# Patient Record
Sex: Female | Born: 1960 | Race: Black or African American | Hispanic: No | Marital: Single | Smoking: Former smoker
Health system: Southern US, Community
[De-identification: ages and names within clinical notes are randomized; demographics above are authoritative.]

## PROBLEM LIST (undated history)

## (undated) DIAGNOSIS — E119 Type 2 diabetes mellitus without complications: Secondary | ICD-10-CM

## (undated) DIAGNOSIS — E7800 Pure hypercholesterolemia, unspecified: Secondary | ICD-10-CM

## (undated) DIAGNOSIS — N200 Calculus of kidney: Secondary | ICD-10-CM

## (undated) DIAGNOSIS — I350 Nonrheumatic aortic (valve) stenosis: Secondary | ICD-10-CM

## (undated) DIAGNOSIS — I10 Essential (primary) hypertension: Secondary | ICD-10-CM

## (undated) DIAGNOSIS — M109 Gout, unspecified: Secondary | ICD-10-CM

## (undated) DIAGNOSIS — N189 Chronic kidney disease, unspecified: Secondary | ICD-10-CM

## (undated) HISTORY — PX: ABDOMINAL HYSTERECTOMY: SHX81

## (undated) HISTORY — DX: Chronic kidney disease, unspecified: N18.9

## (undated) HISTORY — DX: Calculus of kidney: N20.0

## (undated) HISTORY — DX: Nonrheumatic aortic (valve) stenosis: I35.0

---

## 1998-06-05 ENCOUNTER — Emergency Department (HOSPITAL_COMMUNITY): Admission: EM | Admit: 1998-06-05 | Discharge: 1998-06-05 | Payer: Self-pay | Admitting: *Deleted

## 2005-11-21 ENCOUNTER — Emergency Department (HOSPITAL_COMMUNITY): Admission: EM | Admit: 2005-11-21 | Discharge: 2005-11-21 | Payer: Self-pay | Admitting: Emergency Medicine

## 2006-02-09 ENCOUNTER — Emergency Department (HOSPITAL_COMMUNITY): Admission: EM | Admit: 2006-02-09 | Discharge: 2006-02-09 | Payer: Self-pay | Admitting: Emergency Medicine

## 2006-06-25 ENCOUNTER — Emergency Department (HOSPITAL_COMMUNITY): Admission: EM | Admit: 2006-06-25 | Discharge: 2006-06-25 | Payer: Self-pay | Admitting: Emergency Medicine

## 2006-10-16 ENCOUNTER — Emergency Department (HOSPITAL_COMMUNITY): Admission: EM | Admit: 2006-10-16 | Discharge: 2006-10-16 | Payer: Self-pay | Admitting: Emergency Medicine

## 2006-10-26 ENCOUNTER — Emergency Department (HOSPITAL_COMMUNITY): Admission: EM | Admit: 2006-10-26 | Discharge: 2006-10-26 | Payer: Self-pay | Admitting: Emergency Medicine

## 2007-02-07 ENCOUNTER — Ambulatory Visit: Payer: Self-pay | Admitting: Internal Medicine

## 2007-02-07 LAB — CONVERTED CEMR LAB
AST: 19 units/L (ref 0–37)
Alkaline Phosphatase: 88 units/L (ref 39–117)
BUN: 10 mg/dL (ref 6–23)
Basophils Relative: 1 % (ref 0–1)
Eosinophils Absolute: 0.1 10*3/uL (ref 0.0–0.7)
Eosinophils Relative: 1 % (ref 0–5)
Glucose, Bld: 119 mg/dL — ABNORMAL HIGH (ref 70–99)
HCT: 41.9 % (ref 36.0–46.0)
HDL: 50 mg/dL (ref >39)
LDL Cholesterol: 241 mg/dL — ABNORMAL HIGH (ref 0–99)
Lymphs Abs: 1.7 10*3/uL (ref 0.7–4.0)
MCHC: 32.7 g/dL (ref 30.0–36.0)
MCV: 90.1 fL (ref 78.0–100.0)
Monocytes Relative: 8 % (ref 3–12)
Platelets: 354 10*3/uL (ref 150–400)
RBC: 4.65 M/uL (ref 3.87–5.11)
Total Bilirubin: 0.3 mg/dL (ref 0.3–1.2)
Total CHOL/HDL Ratio: 6.5
Triglycerides: 181 mg/dL — ABNORMAL HIGH (ref <150)
VLDL: 36 mg/dL (ref 0–40)
WBC: 6.1 10*3/uL (ref 4.0–10.5)

## 2007-02-14 ENCOUNTER — Ambulatory Visit: Payer: Self-pay | Admitting: *Deleted

## 2007-04-17 ENCOUNTER — Ambulatory Visit: Payer: Self-pay | Admitting: Internal Medicine

## 2007-04-24 ENCOUNTER — Encounter (INDEPENDENT_AMBULATORY_CARE_PROVIDER_SITE_OTHER): Payer: Self-pay | Admitting: Family Medicine

## 2007-04-24 ENCOUNTER — Ambulatory Visit: Payer: Self-pay | Admitting: Internal Medicine

## 2007-04-24 LAB — CONVERTED CEMR LAB
Basophils Absolute: 0 10*3/uL (ref 0.0–0.1)
CO2: 26 meq/L (ref 19–32)
Calcium: 9.1 mg/dL (ref 8.4–10.5)
Chloride: 104 meq/L (ref 96–112)
Eosinophils Relative: 2 % (ref 0–5)
Lymphocytes Relative: 22 % (ref 12–46)
Neutro Abs: 5.1 10*3/uL (ref 1.7–7.7)
Neutrophils Relative %: 63 % (ref 43–77)
Platelets: 298 10*3/uL (ref 150–400)
Potassium: 4 meq/L (ref 3.5–5.3)
RDW: 14.6 % (ref 11.5–15.5)
Rhuematoid fact SerPl-aCnc: <20 intl units/mL (ref 0–20)
Sodium: 141 meq/L (ref 135–145)
T3, Total: 132.9 ng/dL (ref 80.0–204.0)

## 2007-07-24 ENCOUNTER — Encounter: Admission: RE | Admit: 2007-07-24 | Discharge: 2007-07-24 | Payer: Self-pay | Admitting: Internal Medicine

## 2007-08-03 ENCOUNTER — Ambulatory Visit (HOSPITAL_COMMUNITY): Admission: RE | Admit: 2007-08-03 | Discharge: 2007-08-03 | Payer: Self-pay | Admitting: Cardiology

## 2008-04-01 ENCOUNTER — Ambulatory Visit: Payer: Self-pay | Admitting: Internal Medicine

## 2008-04-01 ENCOUNTER — Encounter (INDEPENDENT_AMBULATORY_CARE_PROVIDER_SITE_OTHER): Payer: Self-pay | Admitting: Adult Health

## 2008-04-01 LAB — CONVERTED CEMR LAB
Albumin: 3.5 g/dL (ref 3.5–5.2)
BUN: 6 mg/dL (ref 6–23)
Band Neutrophils: 0 % (ref 0–10)
Basophils Relative: 1 % (ref 0–1)
Calcium: 9.2 mg/dL (ref 8.4–10.5)
Cholesterol: 219 mg/dL — ABNORMAL HIGH (ref 0–200)
Creatinine, Ser: 0.85 mg/dL (ref 0.40–1.20)
Eosinophils Absolute: 0.1 10*3/uL (ref 0.0–0.7)
Glucose, Bld: 140 mg/dL — ABNORMAL HIGH (ref 70–99)
HDL: 46 mg/dL (ref >39)
LDL Cholesterol: 142 mg/dL — ABNORMAL HIGH (ref 0–99)
Lymphs Abs: 1.6 10*3/uL (ref 0.7–4.0)
MCHC: 31.6 g/dL (ref 30.0–36.0)
MCV: 92.6 fL (ref 78.0–100.0)
Neutrophils Relative %: 56 % (ref 43–77)
Platelets: 267 10*3/uL (ref 150–400)
RBC: 4.07 M/uL (ref 3.87–5.11)
TSH: 1.009 microintl units/mL (ref 0.350–4.500)
Total Protein: 6.4 g/dL (ref 6.0–8.3)
Triglycerides: 157 mg/dL — ABNORMAL HIGH (ref <150)
Vit D, 25-Hydroxy: 9 ng/mL — ABNORMAL LOW (ref 30–89)

## 2008-04-07 ENCOUNTER — Encounter (INDEPENDENT_AMBULATORY_CARE_PROVIDER_SITE_OTHER): Payer: Self-pay | Admitting: Adult Health

## 2008-04-07 LAB — CONVERTED CEMR LAB: Hgb A1c MFr Bld: 7.1 % — ABNORMAL HIGH (ref 4.6–6.1)

## 2008-04-22 ENCOUNTER — Ambulatory Visit: Payer: Self-pay | Admitting: Internal Medicine

## 2008-05-12 ENCOUNTER — Ambulatory Visit: Payer: Self-pay | Admitting: Internal Medicine

## 2008-06-02 ENCOUNTER — Ambulatory Visit: Payer: Self-pay | Admitting: Internal Medicine

## 2008-07-24 ENCOUNTER — Ambulatory Visit: Payer: Self-pay | Admitting: Internal Medicine

## 2008-09-03 ENCOUNTER — Emergency Department (HOSPITAL_COMMUNITY): Admission: EM | Admit: 2008-09-03 | Discharge: 2008-09-03 | Payer: Self-pay | Admitting: Emergency Medicine

## 2008-10-24 ENCOUNTER — Encounter (INDEPENDENT_AMBULATORY_CARE_PROVIDER_SITE_OTHER): Payer: Self-pay | Admitting: Adult Health

## 2008-10-24 ENCOUNTER — Ambulatory Visit: Payer: Self-pay | Admitting: Internal Medicine

## 2008-10-24 LAB — CONVERTED CEMR LAB
ALT: 40 units/L — ABNORMAL HIGH (ref 0–35)
AST: 36 units/L (ref 0–37)
CO2: 29 meq/L (ref 19–32)
Calcium: 9.3 mg/dL (ref 8.4–10.5)
Chloride: 103 meq/L (ref 96–112)
Cholesterol: 146 mg/dL (ref 0–200)
Creatinine, Ser: 1.09 mg/dL (ref 0.40–1.20)
Helicobacter Pylori Antibody-IgG: <0.4
Potassium: 3.2 meq/L — ABNORMAL LOW (ref 3.5–5.3)
Sed Rate: 2 mm/hr (ref 0–22)
Sodium: 143 meq/L (ref 135–145)
Total CHOL/HDL Ratio: 3.7
Total Protein: 6.4 g/dL (ref 6.0–8.3)

## 2009-01-21 ENCOUNTER — Encounter (INDEPENDENT_AMBULATORY_CARE_PROVIDER_SITE_OTHER): Payer: Self-pay | Admitting: Adult Health

## 2009-01-21 ENCOUNTER — Ambulatory Visit: Payer: Self-pay | Admitting: Internal Medicine

## 2009-01-21 LAB — CONVERTED CEMR LAB
ALT: 42 units/L — ABNORMAL HIGH (ref 0–35)
AST: 33 units/L (ref 0–37)
Calcium: 8.8 mg/dL (ref 8.4–10.5)
Chloride: 107 meq/L (ref 96–112)
Creatinine, Ser: 1.08 mg/dL (ref 0.40–1.20)
Sodium: 142 meq/L (ref 135–145)
Total Bilirubin: 0.3 mg/dL (ref 0.3–1.2)
Total CHOL/HDL Ratio: 6.3
Total Protein: 6.3 g/dL (ref 6.0–8.3)
Uric Acid, Serum: 12.2 mg/dL — ABNORMAL HIGH (ref 2.4–7.0)
VLDL: 34 mg/dL (ref 0–40)
Vit D, 25-Hydroxy: 27 ng/mL — ABNORMAL LOW (ref 30–89)

## 2009-02-27 ENCOUNTER — Ambulatory Visit (HOSPITAL_COMMUNITY): Admission: RE | Admit: 2009-02-27 | Discharge: 2009-02-27 | Payer: Self-pay | Admitting: Family Medicine

## 2009-03-13 ENCOUNTER — Encounter (INDEPENDENT_AMBULATORY_CARE_PROVIDER_SITE_OTHER): Payer: Self-pay | Admitting: *Deleted

## 2009-03-25 ENCOUNTER — Ambulatory Visit: Payer: Self-pay | Admitting: Internal Medicine

## 2009-03-25 ENCOUNTER — Encounter (INDEPENDENT_AMBULATORY_CARE_PROVIDER_SITE_OTHER): Payer: Self-pay | Admitting: Adult Health

## 2009-03-25 LAB — CONVERTED CEMR LAB
ALT: 27 units/L (ref 0–35)
AST: 27 units/L (ref 0–37)
Alkaline Phosphatase: 66 units/L (ref 39–117)
LDL Cholesterol: 212 mg/dL — ABNORMAL HIGH (ref 0–99)
Potassium: 4.2 meq/L (ref 3.5–5.3)
Sodium: 140 meq/L (ref 135–145)
Total Bilirubin: 0.3 mg/dL (ref 0.3–1.2)
Total Protein: 7.1 g/dL (ref 6.0–8.3)
Uric Acid, Serum: 9.1 mg/dL — ABNORMAL HIGH (ref 2.4–7.0)
VLDL: 32 mg/dL (ref 0–40)

## 2009-05-19 ENCOUNTER — Ambulatory Visit: Payer: Self-pay | Admitting: Internal Medicine

## 2009-07-06 ENCOUNTER — Ambulatory Visit: Payer: Self-pay | Admitting: Internal Medicine

## 2009-07-06 ENCOUNTER — Encounter (INDEPENDENT_AMBULATORY_CARE_PROVIDER_SITE_OTHER): Payer: Self-pay | Admitting: Adult Health

## 2009-07-06 LAB — CONVERTED CEMR LAB: Microalb, Ur: 0.81 mg/dL (ref 0.00–1.89)

## 2009-07-30 ENCOUNTER — Emergency Department (HOSPITAL_COMMUNITY): Admission: EM | Admit: 2009-07-30 | Discharge: 2009-07-30 | Payer: Self-pay | Admitting: Emergency Medicine

## 2009-08-25 ENCOUNTER — Ambulatory Visit: Payer: Self-pay | Admitting: Family Medicine

## 2009-10-09 ENCOUNTER — Encounter (INDEPENDENT_AMBULATORY_CARE_PROVIDER_SITE_OTHER): Payer: Self-pay | Admitting: Family Medicine

## 2009-10-09 LAB — CONVERTED CEMR LAB
Cholesterol: 270 mg/dL — ABNORMAL HIGH (ref 0–200)
VLDL: 29 mg/dL (ref 0–40)

## 2010-02-14 ENCOUNTER — Encounter: Payer: Self-pay | Admitting: Internal Medicine

## 2010-02-25 NOTE — Letter (Signed)
Summary: Generic Letter  [facility]  [address]   [place], [place] [address]   Phone: [phone]  Fax: [phone]    03/13/2009  NYELLA ECKELS [address] [place], [place]  [address]  Dear Ms. CARLTON, Your Bone Scan was within normal limits. For continued bone health take Calcium 1200mg  and Vitamin D 800IU daily.          Sincerely,   Gaylyn Cheers RN

## 2012-09-21 ENCOUNTER — Other Ambulatory Visit (HOSPITAL_COMMUNITY): Payer: Self-pay | Admitting: Nurse Practitioner

## 2012-09-21 DIAGNOSIS — R109 Unspecified abdominal pain: Secondary | ICD-10-CM

## 2012-09-27 ENCOUNTER — Ambulatory Visit (HOSPITAL_COMMUNITY)
Admission: RE | Admit: 2012-09-27 | Discharge: 2012-09-27 | Disposition: A | Discharge status: Home / Self Care | Payer: No Typology Code available for payment source | Source: Ambulatory Visit | Attending: Nurse Practitioner | Admitting: Nurse Practitioner

## 2012-09-27 DIAGNOSIS — R109 Unspecified abdominal pain: Secondary | ICD-10-CM

## 2012-09-27 DIAGNOSIS — K8020 Calculus of gallbladder without cholecystitis without obstruction: Secondary | ICD-10-CM | POA: Insufficient documentation

## 2014-04-14 ENCOUNTER — Encounter (HOSPITAL_COMMUNITY): Payer: Self-pay | Admitting: *Deleted

## 2014-04-14 ENCOUNTER — Emergency Department (HOSPITAL_COMMUNITY)
Admission: EM | Admit: 2014-04-14 | Discharge: 2014-04-14 | Disposition: A | Discharge status: Home / Self Care | Payer: No Typology Code available for payment source | Attending: Emergency Medicine | Admitting: Emergency Medicine

## 2014-04-14 DIAGNOSIS — M542 Cervicalgia: Secondary | ICD-10-CM | POA: Insufficient documentation

## 2014-04-14 DIAGNOSIS — J029 Acute pharyngitis, unspecified: Secondary | ICD-10-CM | POA: Insufficient documentation

## 2014-04-14 DIAGNOSIS — E119 Type 2 diabetes mellitus without complications: Secondary | ICD-10-CM | POA: Insufficient documentation

## 2014-04-14 DIAGNOSIS — Z87891 Personal history of nicotine dependence: Secondary | ICD-10-CM | POA: Insufficient documentation

## 2014-04-14 DIAGNOSIS — R590 Localized enlarged lymph nodes: Secondary | ICD-10-CM | POA: Insufficient documentation

## 2014-04-14 DIAGNOSIS — H9202 Otalgia, left ear: Secondary | ICD-10-CM

## 2014-04-14 DIAGNOSIS — I10 Essential (primary) hypertension: Secondary | ICD-10-CM | POA: Insufficient documentation

## 2014-04-14 HISTORY — DX: Pure hypercholesterolemia, unspecified: E78.00

## 2014-04-14 HISTORY — DX: Type 2 diabetes mellitus without complications: E11.9

## 2014-04-14 HISTORY — DX: Essential (primary) hypertension: I10

## 2014-04-14 HISTORY — DX: Gout, unspecified: M10.9

## 2014-04-14 MED ORDER — TRAMADOL HCL 50 MG PO TABS: 50.0000 mg | ORAL_TABLET | Freq: Four times a day (QID) | ORAL | Status: DC | PRN

## 2014-04-14 MED ORDER — IBUPROFEN 600 MG PO TABS: 600.0000 mg | ORAL_TABLET | Freq: Four times a day (QID) | ORAL | Status: DC | PRN

## 2014-04-14 NOTE — ED Provider Notes (Signed)
CSN: [id number]     Arrival date & time 04/14/14  1730 History  This chart was scribed for a non-physician practitioner, Junius FinnerErin O'Malley, PA-C working with Rolan BuccoMelanie Belfi, MD by [place] Peace, ED Scribe. The patient was seen in TR07C/TR07C. The patient's care was started at 6:23 PM.    Chief Complaint  Patient presents with  . Neck Pain  . Otalgia      Patient is a [age] y.o. female presenting with neck pain and ear pain. The history is provided by the patient. No language interpreter was used.  Neck Pain Pain location:  L side Associated symptoms: no fever   Otalgia Associated symptoms: neck pain and sore throat   Associated symptoms: no congestion, no cough and no fever   HPI Comments: [name] is a 54 y.o. female who presents to the Emergency Department complaining of left-sided throat pain x 2 days. Pt reports that she swallowed some food and felt like it cut her throat. She also complains of pain with swallowing and left ear pain.  Pain is aching and sore, 10/10 at worst. She states she was eating pancakes, eggs, and sausage when incident occurred. No complaints of cough, congestion, or fever. She denies any recent dental surgeries. History of hypertension and DM.    Past Medical History  Diagnosis Date  . Hypertension   . Diabetes mellitus without complication   . Gout   . High cholesterol    Past Surgical History  Procedure Laterality Date  . Abdominal hysterectomy     No family history on file. History  Substance Use Topics  . Smoking status: Former Smoker    Types: Cigarettes  . Smokeless tobacco: Never Used  . Alcohol Use: No   OB History    No data available     Review of Systems  Constitutional: Negative for fever.  HENT: Positive for ear pain, sore throat and trouble swallowing. Negative for congestion.   Respiratory: Negative for cough.   Musculoskeletal: Positive for neck pain.  All other systems reviewed and are negative.     Allergies  Review of  patient's allergies indicates no known allergies.  Home Medications   Prior to Admission medications   Medication Sig Start Date End Date Taking? Authorizing Provider  ibuprofen (ADVIL,MOTRIN) 600 MG tablet Take 1 tablet (600 mg total) by mouth every 6 (six) hours as needed. 04/14/14   Junius FinnerErin O'Malley, PA-C  traMADol (ULTRAM) 50 MG tablet Take 1 tablet (50 mg total) by mouth every 6 (six) hours as needed. 04/14/14   Junius FinnerErin O'Malley, PA-C   BP 147/67 mmHg  Pulse 62  Temp(Src) 98.3 F (36.8 C) (Oral)  Resp 16  Ht 5\' 1"  (1.549 m)  Wt 126 lb (57.153 kg)  BMI 23.82 kg/m2  SpO2 100% Physical Exam  Constitutional: She is oriented to person, place, and time. She appears well-developed and well-nourished.  HENT:  Head: Normocephalic and atraumatic.  Left Ear: Hearing, tympanic membrane, external ear and ear canal normal.  Nose: Nose normal.  Mouth/Throat: Uvula is midline, oropharynx is clear and moist and mucous membranes are normal. Abnormal dentition.  Multiple missing teeth in left lower jaw. No gingival erythema, edema, bleeding or discharge. No evidence of gingival abscess  Eyes: EOM are normal.  Neck: Normal range of motion. No tracheal deviation present.  Cardiovascular: Normal rate.   Pulmonary/Chest: Effort normal. No stridor.  Musculoskeletal: Normal range of motion.  Lymphadenopathy:    She has cervical adenopathy ( left anterior ).  Neurological: She is alert and oriented to person, place, and time.  Skin: Skin is warm and dry.  Psychiatric: She has a normal mood and affect. Her behavior is normal.  Nursing note and vitals reviewed.   ED Course  Procedures (including critical care time) Labs Review Labs Reviewed - No data to display  Imaging Review No results found.   EKG Interpretation None     Medications - No data to display  6:27 PM- Treatment plan was discussed with patient who verbalizes understanding and agrees.   MDM   Final diagnoses:  Left cervical  lymphadenopathy  Neck pain on left side  Left ear pain   Pt c/o left sided throat pain after eating the breakfast 2 days ago.  No tonsillar erythema, edema, exudates. No gingival abscess or surrounding dental caries. Left TM: normal. Left anterior cervical lymphadenopathy present.  Tender to touch. No overlying erythema or warmth of skin.  Will tx conservatively with pain medication and antiinflammatories. Advised to f/u with PCP and ENT, Dr. Jenne Pane within 1 week if not improving. Return precautions provided. Pt verbalized understanding and agreement with tx plan.   I personally performed the services described in this documentation, which was scribed in my presence. The recorded information has been reviewed and is accurate. }   Junius Finner, PA-C 04/14/14 Cindy Hazy, MD 04/15/14 [phone]

## 2014-04-14 NOTE — ED Notes (Signed)
Pt reports left side neck pain radiating to left ear and gums for the past two days. Pt denies any recent dental surgeries. Pt states two days ago she swallowed some food that felt like it cut her throat. Pt reports pain ever since.

## 2014-04-14 NOTE — ED Notes (Signed)
Declined W/C at D/C and was escorted to lobby by RN. 

## 2015-02-17 ENCOUNTER — Emergency Department (HOSPITAL_COMMUNITY)
Admission: EM | Admit: 2015-02-17 | Discharge: 2015-02-17 | Discharge status: Against Medical Advice | Payer: No Typology Code available for payment source | Attending: Emergency Medicine | Admitting: Emergency Medicine

## 2015-02-17 ENCOUNTER — Emergency Department (HOSPITAL_COMMUNITY)
Admission: EM | Admit: 2015-02-17 | Discharge: 2015-02-17 | Disposition: A | Discharge status: Home / Self Care | Payer: No Typology Code available for payment source

## 2015-02-17 ENCOUNTER — Encounter (HOSPITAL_COMMUNITY): Payer: Self-pay | Admitting: *Deleted

## 2015-02-17 DIAGNOSIS — B349 Viral infection, unspecified: Secondary | ICD-10-CM | POA: Insufficient documentation

## 2015-02-17 DIAGNOSIS — R1013 Epigastric pain: Secondary | ICD-10-CM | POA: Insufficient documentation

## 2015-02-17 DIAGNOSIS — E119 Type 2 diabetes mellitus without complications: Secondary | ICD-10-CM | POA: Insufficient documentation

## 2015-02-17 DIAGNOSIS — Z8739 Personal history of other diseases of the musculoskeletal system and connective tissue: Secondary | ICD-10-CM | POA: Insufficient documentation

## 2015-02-17 DIAGNOSIS — I10 Essential (primary) hypertension: Secondary | ICD-10-CM | POA: Insufficient documentation

## 2015-02-17 DIAGNOSIS — Z87891 Personal history of nicotine dependence: Secondary | ICD-10-CM | POA: Insufficient documentation

## 2015-02-17 LAB — URINALYSIS, ROUTINE W REFLEX MICROSCOPIC
Bilirubin Urine: NEGATIVE
GLUCOSE, UA: NEGATIVE mg/dL
Ketones, ur: NEGATIVE mg/dL
NITRITE: NEGATIVE
PH: 6.5 (ref 5.0–8.0)
PROTEIN: NEGATIVE mg/dL
Specific Gravity, Urine: 1.017 (ref 1.005–1.030)

## 2015-02-17 LAB — BASIC METABOLIC PANEL
ANION GAP: 9 (ref 5–15)
BUN: 8 mg/dL (ref 6–20)
CO2: 26 mmol/L (ref 22–32)
Calcium: 9.6 mg/dL (ref 8.9–10.3)
Chloride: 106 mmol/L (ref 101–111)
Creatinine, Ser: 0.77 mg/dL (ref 0.44–1.00)
GFR calc Af Amer: >60 mL/min (ref >60)
GFR calc non Af Amer: >60 mL/min (ref >60)
GLUCOSE: 134 mg/dL — AB (ref 65–99)
POTASSIUM: 3.8 mmol/L (ref 3.5–5.1)
Sodium: 141 mmol/L (ref 135–145)

## 2015-02-17 LAB — URINE MICROSCOPIC-ADD ON

## 2015-02-17 LAB — CBC
HEMATOCRIT: 39 % (ref 36.0–46.0)
HEMOGLOBIN: 12.9 g/dL (ref 12.0–15.0)
MCH: 29.3 pg (ref 26.0–34.0)
MCHC: 33.1 g/dL (ref 30.0–36.0)
MCV: 88.6 fL (ref 78.0–100.0)
Platelets: 246 10*3/uL (ref 150–400)
RBC: 4.4 MIL/uL (ref 3.87–5.11)
RDW: 13.8 % (ref 11.5–15.5)
WBC: 6.3 10*3/uL (ref 4.0–10.5)

## 2015-02-17 MED ORDER — ONDANSETRON HCL 4 MG PO TABS: 4.0000 mg | ORAL_TABLET | Freq: Three times a day (TID) | ORAL | Status: DC | PRN

## 2015-02-17 MED ORDER — ONDANSETRON 4 MG PO TBDP
4.0000 mg | ORAL_TABLET | Freq: Once | ORAL | Status: AC
  Administered 2015-02-17: 4 mg via ORAL
  Filled 2015-02-17: qty 1

## 2015-02-17 NOTE — ED Notes (Signed)
Pt sts she has to leave because her ride is leaving. Pt understands that she will be signing out against medical advice.

## 2015-02-17 NOTE — ED Notes (Signed)
Pt here with flu like symptoms.  HA, congestion, URI, cough, body aches, nausea and vomiting.  Pt has been able to hold down fluids and does not appear in any distress

## 2015-02-17 NOTE — ED Notes (Signed)
Called for triage no answer  

## 2015-02-17 NOTE — ED Provider Notes (Signed)
CSN: [id number]     Arrival date & time 02/17/15  2109 History  By signing my name below, I, Rachel Brock, attest that this documentation has been prepared under the direction and in the presence of non-physician practitioner, Elizabeth Sauer, PA-C. Electronically Signed: Linna Brock, Scribe. 02/17/2015. 10:10 PM.    Chief Complaint  Patient presents with  . Illness   The history is provided by the patient. No language interpreter was used.    HPI Comments: Rachel Brock is a 55 y.o. female who presents to the Emergency Department complaining of flu like symptoms including productive cough with yellow sputum x 4 days. Pt endorses associated subjective fever, headaches, congestion, epigastric abdominal cramping, nausea, vomiting, and diarrhea. Pt reports that she vomited three times today and twice yesterday. Pt had 2 episodes of diarrhea today. She states that she began taking Theraflu today with relief after feeling "very hot."  Pt is able to tolerate fluids. Pt denies SOB, hematochezia, or any other associated symptoms at this time.  Past Medical History  Diagnosis Date  . Hypertension   . Diabetes mellitus without complication (HCC)   . Gout   . High cholesterol    Past Surgical History  Procedure Laterality Date  . Abdominal hysterectomy     No family history on file. Social History  Substance Use Topics  . Smoking status: Former Smoker    Types: Cigarettes  . Smokeless tobacco: Never Used  . Alcohol Use: No   OB History    No data available     Review of Systems  Constitutional: Positive for fever (Subjective).  HENT: Positive for congestion.   Eyes: Negative for visual disturbance.  Respiratory: Positive for cough.   Cardiovascular: Negative for chest pain.  Gastrointestinal: Positive for nausea, vomiting, abdominal pain and diarrhea. Negative for blood in stool.  Genitourinary: Negative for dysuria.  Musculoskeletal: Negative for back pain.  Allergic/Immunologic:  Negative for immunocompromised state.  Neurological: Positive for headaches.   Allergies  Review of patient's allergies indicates no known allergies.  Home Medications   Prior to Admission medications   Medication Sig Start Date End Date Taking? Authorizing Provider  ibuprofen (ADVIL,MOTRIN) 600 MG tablet Take 1 tablet (600 mg total) by mouth every 6 (six) hours as needed. 04/14/14   Junius Finner, PA-C  ondansetron (ZOFRAN) 4 MG tablet Take 1 tablet (4 mg total) by mouth every 8 (eight) hours as needed for nausea or vomiting. 02/17/15   Chase Picket Rector Devonshire, PA-C  traMADol (ULTRAM) 50 MG tablet Take 1 tablet (50 mg total) by mouth every 6 (six) hours as needed. 04/14/14   Junius Finner, PA-C   BP 157/96 mmHg  Pulse 81  Temp(Src) 98.2 F (36.8 C) (Oral)  Resp 16  Ht  (1.549 m)  Wt 72.576 kg  BMI 30.25 kg/m2  SpO2 100% Physical Exam  Constitutional: She is oriented to person, place, and time. She appears well-developed and well-nourished. No distress.  HENT:  Head: Normocephalic and atraumatic.  Nose: Mucosal edema present.  Mouth/Throat: Oropharynx is clear and moist.  Nasal congestion  Eyes: Conjunctivae and EOM are normal.  Neck: Neck supple. No tracheal deviation present.  Cardiovascular: Normal rate.   Pulmonary/Chest: Effort normal. No respiratory distress.  Abdominal: There is tenderness in the epigastric area.  Musculoskeletal: Normal range of motion.  Lymphadenopathy:    She has no cervical adenopathy.  Neurological: She is alert and oriented to person, place, and time.  Skin: Skin is warm and dry.  Psychiatric: She has a normal mood and affect. Her behavior is normal.  Nursing note and vitals reviewed.   ED Course  Procedures (including critical care time)  DIAGNOSTIC STUDIES: Oxygen Saturation is 100% on RA, normal by my interpretation.    COORDINATION OF CARE:   9:36 PM Will order Zofran. Discussed treatment plan with pt at bedside and pt agreed to  plan.   Labs Review Labs Reviewed  URINALYSIS, ROUTINE W REFLEX MICROSCOPIC (NOT AT [facility]) - Abnormal; Notable for the following:    APPearance CLOUDY (*)    Hgb urine dipstick SMALL (*)    Leukocytes, UA TRACE (*)    All other components within normal limits  URINE MICROSCOPIC-ADD ON - Abnormal; Notable for the following:    Squamous Epithelial / LPF 6-30 (*)    Bacteria, UA FEW (*)    All other components within normal limits  CBC  BASIC METABOLIC PANEL    Imaging Review No results found. I have personally reviewed and evaluated these lab results as part of my medical decision-making.   EKG Interpretation None      MDM   Final diagnoses:  Viral syndrome   [name] presents with cough/congestion and n/v/d. Vitals reviewed and reassuring.   Patient stating she has to leave, but labwork has not resulted yet. I have discussed my concerns as a provider and the possibility that this may worsen, especially with no lab results at present. We discussed the nature, risks and benefits, and alternatives to treatment. I have specifically discussed that without further evaluation I cannot guarantee there is not a life threatening event occuring. Time was given to allow the opportunity to ask questions and consider the options, and after the discussion, the patient decided to refuse the offered treatment. Pt is A&Ox4, his own POA and states understanding of my concerns and the possible consequences. After refusal, I made every reasonable opportunity to treat them to the best of my ability. A rx for zofran was given for n/v.  I have made the patient aware that this is an AMA discharge, but she may return at any time for further evaluation and treatment.  I personally performed the services described in this documentation, which was scribed in my presence. The recorded information has been reviewed and is accurate.    [facility] Jaequan Propes, PA-C 02/17/15 2223  Arby Barrette,  MD 02/18/15 [phone]

## 2015-02-17 NOTE — ED Notes (Signed)
Called for triage x2 no answer

## 2016-10-01 ENCOUNTER — Emergency Department (HOSPITAL_COMMUNITY)
Admission: EM | Admit: 2016-10-01 | Discharge: 2016-10-01 | Disposition: A | Discharge status: Home / Self Care | Payer: Self-pay | Attending: Emergency Medicine | Admitting: Emergency Medicine

## 2016-10-01 ENCOUNTER — Emergency Department (HOSPITAL_COMMUNITY): Payer: Self-pay

## 2016-10-01 ENCOUNTER — Encounter (HOSPITAL_COMMUNITY): Payer: Self-pay | Admitting: Emergency Medicine

## 2016-10-01 DIAGNOSIS — I10 Essential (primary) hypertension: Secondary | ICD-10-CM | POA: Insufficient documentation

## 2016-10-01 DIAGNOSIS — Z791 Long term (current) use of non-steroidal anti-inflammatories (NSAID): Secondary | ICD-10-CM | POA: Insufficient documentation

## 2016-10-01 DIAGNOSIS — Z87891 Personal history of nicotine dependence: Secondary | ICD-10-CM | POA: Insufficient documentation

## 2016-10-01 DIAGNOSIS — Z79899 Other long term (current) drug therapy: Secondary | ICD-10-CM | POA: Insufficient documentation

## 2016-10-01 DIAGNOSIS — M25562 Pain in left knee: Secondary | ICD-10-CM | POA: Insufficient documentation

## 2016-10-01 DIAGNOSIS — E119 Type 2 diabetes mellitus without complications: Secondary | ICD-10-CM | POA: Insufficient documentation

## 2016-10-01 DIAGNOSIS — Z7983 Long term (current) use of bisphosphonates: Secondary | ICD-10-CM | POA: Insufficient documentation

## 2016-10-01 MED ORDER — HYDROCODONE-ACETAMINOPHEN 5-325 MG PO TABS
1.0000 | ORAL_TABLET | Freq: Once | ORAL | Status: AC
  Administered 2016-10-01: 1 via ORAL
  Filled 2016-10-01: qty 1

## 2016-10-01 MED ORDER — HYDROCODONE-ACETAMINOPHEN 5-325 MG PO TABS: 1.0000 | ORAL_TABLET | ORAL | 0 refills | Status: DC | PRN

## 2016-10-01 MED ORDER — INDOMETHACIN 25 MG PO CAPS: 25.0000 mg | ORAL_CAPSULE | Freq: Three times a day (TID) | ORAL | 0 refills | Status: DC | PRN

## 2016-10-01 NOTE — ED Triage Notes (Addendum)
C/o L knee pain x 1 week and swelling to L ankle.  Denies injury.  Taking ibuprofen without relief.  States, "I ate grapes last night and the sugar went straight to my knee." Denies sob.

## 2016-10-01 NOTE — ED Notes (Signed)
Pt stable, ambulatory, states understanding of discharge instructions 

## 2016-10-01 NOTE — ED Notes (Signed)
CBG 114 

## 2016-10-01 NOTE — ED Notes (Signed)
Patient transported to X-ray 

## 2016-10-01 NOTE — ED Provider Notes (Signed)
[facility]-EMERGENCY DEPT Provider Note   CSN: [id number] Arrival date & time: 10/01/16  0100     History   Chief Complaint Chief Complaint  Patient presents with  . Knee Pain  . L ankle swelling    HPI Rachel Brock is a 56 y.o. female.  Patient presents with complaint of left knee pain and swelling x 1 week. No known trauma or injury. No history of the same. Pain is limited to knee joint. No fever. She has a history of DM without history of gout. She has taken Tylenol and ibuprofen without relief.    The history is provided by the patient. No language interpreter was used.    Past Medical History:  Diagnosis Date  . Diabetes mellitus without complication (HCC)   . Gout   . High cholesterol   . Hypertension     There are no active problems to display for this patient.   Past Surgical History:  Procedure Laterality Date  . ABDOMINAL HYSTERECTOMY      OB History    No data available       Home Medications    Prior to Admission medications   Medication Sig Start Date End Date Taking? Authorizing Provider  ibuprofen (ADVIL,MOTRIN) 600 MG tablet Take 1 tablet (600 mg total) by mouth every 6 (six) hours as needed. 04/14/14   Lurene Shadow, PA-C  ondansetron (ZOFRAN) 4 MG tablet Take 1 tablet (4 mg total) by mouth every 8 (eight) hours as needed for nausea or vomiting. 02/17/15   Ward, Chase Picket, PA-C  traMADol (ULTRAM) 50 MG tablet Take 1 tablet (50 mg total) by mouth every 6 (six) hours as needed. 04/14/14   Lurene Shadow, PA-C    Family History No family history on file.  Social History Social History  Substance Use Topics  . Smoking status: Former Smoker    Types: Cigarettes  . Smokeless tobacco: Never Used  . Alcohol use No     Allergies   Patient has no known allergies.   Review of Systems Review of Systems  Constitutional: Negative for chills and fever.  Musculoskeletal: Positive for arthralgias and joint swelling.       See HPI.  Skin:  Negative.  Negative for color change and wound.  Neurological: Negative.  Negative for weakness and numbness.     Physical Exam Updated Vital Signs BP (!) 143/92   Pulse (!) 57   Temp 97.7 F (36.5 C) (Oral)   Resp 18   Ht  (1.549 m)   Wt 83.9 kg (185 lb)   SpO2 100%   BMI 34.96 kg/m   Physical Exam  Constitutional: She is oriented to person, place, and time. She appears well-developed and well-nourished.  Neck: Normal range of motion.  Cardiovascular: Intact distal pulses.   Pulmonary/Chest: Effort normal.  Musculoskeletal:  Left knee is mildly swollen. There is crepitance on passive/active ROM. No redness. Thigh and calf non-tender.   Neurological: She is alert and oriented to person, place, and time.  Skin: Skin is warm and dry.     ED Treatments / Results  Labs (all labs ordered are listed, but only abnormal results are displayed) Labs Reviewed  CBG MONITORING, ED    EKG  EKG Interpretation None       Radiology Dg Knee Complete 4 Views Left  Result Date: 10/01/2016 CLINICAL DATA:  Left knee pain for 1 week. No known injury. Swelling. EXAM: LEFT KNEE - COMPLETE 4+ VIEW COMPARISON:  None. FINDINGS: Left knee appears intact. No significant effusion. No evidence of acute fracture or subluxation. No focal bone lesion or bone destruction. Bone cortex and trabecular architecture appear intact. No radiopaque soft tissue foreign bodies. Soft tissue calcifications likely represent phleboliths. IMPRESSION: No acute bony abnormalities. Electronically Signed   By: Burman NievesWilliam  Stevens M.D.   On: 10/01/2016 05:53    Procedures Procedures (including critical care time)  Medications Ordered in ED Medications  HYDROcodone-acetaminophen (NORCO/VICODIN) 5-325 MG per tablet 1 tablet (1 tablet Oral Given 10/01/16 [id number])     Initial Impression / Assessment and Plan / ED Course  I have reviewed the triage vital signs and the nursing notes.  Pertinent labs & imaging results  that were available during my care of the patient were reviewed by me and considered in my medical decision making (see chart for details).     Patient presents with left knee pain x 1 week. No injury or trauma. OTC medications without relief.   Imaging is negative. Pain is improved with Norco in the ED. She can be discharged home with PCP follow up.  Final Clinical Impressions(s) / ED Diagnoses   Final diagnoses:  None   1. Left knee pain   New Prescriptions New Prescriptions   No medications on file     Elpidio AnisUpstill, Lilyann Gravelle, Cordelia Poche-C 10/01/16 0636    Melene PlanFloyd, Dan, DO 10/01/16 [phone]

## 2016-10-03 LAB — CBG MONITORING, ED: Glucose-Capillary: 114 mg/dL — ABNORMAL HIGH (ref 65–99)

## 2016-12-30 ENCOUNTER — Emergency Department (HOSPITAL_COMMUNITY): Payer: Self-pay

## 2016-12-30 ENCOUNTER — Other Ambulatory Visit: Payer: Self-pay

## 2016-12-30 ENCOUNTER — Encounter (HOSPITAL_COMMUNITY): Payer: Self-pay

## 2016-12-30 ENCOUNTER — Emergency Department (HOSPITAL_COMMUNITY)
Admission: EM | Admit: 2016-12-30 | Discharge: 2016-12-30 | Disposition: A | Discharge status: Home / Self Care | Payer: Self-pay | Attending: Emergency Medicine | Admitting: Emergency Medicine

## 2016-12-30 DIAGNOSIS — Z79899 Other long term (current) drug therapy: Secondary | ICD-10-CM | POA: Insufficient documentation

## 2016-12-30 DIAGNOSIS — Z7984 Long term (current) use of oral hypoglycemic drugs: Secondary | ICD-10-CM | POA: Insufficient documentation

## 2016-12-30 DIAGNOSIS — R05 Cough: Secondary | ICD-10-CM | POA: Insufficient documentation

## 2016-12-30 DIAGNOSIS — I10 Essential (primary) hypertension: Secondary | ICD-10-CM | POA: Insufficient documentation

## 2016-12-30 DIAGNOSIS — E119 Type 2 diabetes mellitus without complications: Secondary | ICD-10-CM | POA: Insufficient documentation

## 2016-12-30 DIAGNOSIS — R059 Cough, unspecified: Secondary | ICD-10-CM

## 2016-12-30 DIAGNOSIS — Z87891 Personal history of nicotine dependence: Secondary | ICD-10-CM | POA: Insufficient documentation

## 2016-12-30 DIAGNOSIS — E7800 Pure hypercholesterolemia, unspecified: Secondary | ICD-10-CM | POA: Insufficient documentation

## 2016-12-30 LAB — I-STAT TROPONIN, ED: Troponin i, poc: 0 ng/mL (ref 0.00–0.08)

## 2016-12-30 LAB — CBC
HCT: 39.9 % (ref 36.0–46.0)
Hemoglobin: 13 g/dL (ref 12.0–15.0)
MCH: 28.6 pg (ref 26.0–34.0)
MCHC: 32.6 g/dL (ref 30.0–36.0)
MCV: 87.7 fL (ref 78.0–100.0)
PLATELETS: 255 10*3/uL (ref 150–400)
RBC: 4.55 MIL/uL (ref 3.87–5.11)
RDW: 14.4 % (ref 11.5–15.5)
WBC: 5.3 10*3/uL (ref 4.0–10.5)

## 2016-12-30 LAB — I-STAT BETA HCG BLOOD, ED (MC, WL, AP ONLY): I-stat hCG, quantitative: <5 m[IU]/mL (ref <5)

## 2016-12-30 LAB — BASIC METABOLIC PANEL
Anion gap: 6 (ref 5–15)
BUN: 8 mg/dL (ref 6–20)
CHLORIDE: 109 mmol/L (ref 101–111)
CO2: 24 mmol/L (ref 22–32)
CREATININE: 0.93 mg/dL (ref 0.44–1.00)
Calcium: 9.8 mg/dL (ref 8.9–10.3)
GFR calc non Af Amer: >60 mL/min (ref >60)
Glucose, Bld: 152 mg/dL — ABNORMAL HIGH (ref 65–99)
Potassium: 4.7 mmol/L (ref 3.5–5.1)
Sodium: 139 mmol/L (ref 135–145)

## 2016-12-30 MED ORDER — ALBUTEROL SULFATE HFA 108 (90 BASE) MCG/ACT IN AERS: 1.0000 | INHALATION_SPRAY | Freq: Four times a day (QID) | RESPIRATORY_TRACT | 0 refills | Status: DC | PRN

## 2016-12-30 NOTE — ED Provider Notes (Signed)
[facility] Provider Note   CSN: [id number] Arrival date & time: 12/30/16  [id number]     History   Chief Complaint Chief Complaint  Patient presents with  . Chest Pain    HPI Rachel Brock is a 56 y.o. female. Chief complaint is cough and chest pain.  HPI  no history of heart or lung disease. Patient states her last today she'll feel a bit of a cough. When she coughs for her and she feels short of breath and tight with her chest and it seems to pass. This happens sometimes at night sometimes during the day. No fever. No chest pain. States occasionally make her feel tightness in the back of her neck when she coughs a lot.  Past Medical History:  Diagnosis Date  . Diabetes mellitus without complication (HCC)   . Gout   . High cholesterol   . Hypertension     There are no active problems to display for this patient.   Past Surgical History:  Procedure Laterality Date  . ABDOMINAL HYSTERECTOMY      OB History    No data available       Home Medications    Prior to Admission medications   Medication Sig Start Date End Date Taking? Authorizing Provider  metFORMIN (GLUCOPHAGE) 500 MG tablet Take 500 mg by mouth 2 (two) times daily with a meal.   Yes [provider]  PRESCRIPTION MEDICATION Take 1 tablet by mouth every morning. BLOOD PRESSURE MEDICATION   Yes [provider]  rosuvastatin (CRESTOR) 40 MG tablet Take 40 mg by mouth every morning.   Yes [provider]  albuterol (PROVENTIL HFA;VENTOLIN HFA) 108 (90 Base) MCG/ACT inhaler Inhale 1-2 puffs into the lungs every 6 (six) hours as needed for wheezing or shortness of breath. 12/30/16   Rolland PorterJames, Aquiles Ruffini, MD  HYDROcodone-acetaminophen (NORCO/VICODIN) 5-325 MG tablet Take 1 tablet by mouth every 4 (four) hours as needed. Patient not taking: Reported on 12/30/2016 10/01/16   Elpidio AnisUpstill, Shari, PA-C  ibuprofen (ADVIL,MOTRIN) 600 MG tablet Take 1 tablet (600 mg total) by  mouth every 6 (six) hours as needed. Patient not taking: Reported on 12/30/2016 04/14/14   Lurene ShadowPhelps, Erin O, PA-C  indomethacin (INDOCIN) 25 MG capsule Take 1 capsule (25 mg total) by mouth 3 (three) times daily as needed. Patient not taking: Reported on 12/30/2016 10/01/16   Elpidio AnisUpstill, Shari, PA-C  ondansetron (ZOFRAN) 4 MG tablet Take 1 tablet (4 mg total) by mouth every 8 (eight) hours as needed for nausea or vomiting. Patient not taking: Reported on 12/30/2016 02/17/15   Ward, Chase PicketJaime Pilcher, PA-C  traMADol (ULTRAM) 50 MG tablet Take 1 tablet (50 mg total) by mouth every 6 (six) hours as needed. Patient not taking: Reported on 12/30/2016 04/14/14   Rolla PlatePhelps, Erin O, PA-C    Family History No family history on file.  Social History Social History   Tobacco Use  . Smoking status: Former Smoker    Types: Cigarettes  . Smokeless tobacco: Never Used  Substance Use Topics  . Alcohol use: No  . Drug use: No     Allergies   Patient has no known allergies.   Review of Systems Review of Systems  Constitutional: Negative for appetite change, chills, diaphoresis, fatigue and fever.  HENT: Negative for mouth sores, sore throat and trouble swallowing.   Eyes: Negative for visual disturbance.  Respiratory: Positive for cough and wheezing. Negative for chest tightness and shortness of breath.  Cardiovascular: Negative for chest pain.  Gastrointestinal: Negative for abdominal distention, abdominal pain, diarrhea, nausea and vomiting.  Endocrine: Negative for polydipsia, polyphagia and polyuria.  Genitourinary: Negative for dysuria, frequency and hematuria.  Musculoskeletal: Negative for gait problem.  Skin: Negative for color change, pallor and rash.  Neurological: Negative for dizziness, syncope, light-headedness and headaches.  Hematological: Does not bruise/bleed easily.  Psychiatric/Behavioral: Negative for behavioral problems and confusion.     Physical Exam Updated Vital Signs BP (!)  180/70 (BP Location: Right Arm)   Pulse (!) 56   Temp 97.8 F (36.6 C) (Oral)   Resp 17   Ht 5\' 2"  (1.575 m)   Wt 81.6 kg (180 lb)   SpO2 100%   BMI 32.92 kg/m   Physical Exam  Constitutional: She is oriented to person, place, and time. She appears well-developed and well-nourished. No distress.  HENT:  Head: Normocephalic.  Eyes: Conjunctivae are normal. Pupils are equal, round, and reactive to light. No scleral icterus.  Neck: Normal range of motion. Neck supple. No thyromegaly present.  Cardiovascular: Normal rate and regular rhythm. Exam reveals no gallop and no friction rub.  No murmur heard. Pulmonary/Chest: Effort normal and breath sounds normal. No respiratory distress. She has no wheezes. She has no rales.  Abdominal: Soft. Bowel sounds are normal. She exhibits no distension. There is no tenderness. There is no rebound.  Musculoskeletal: Normal range of motion.  Neurological: She is alert and oriented to person, place, and time.  Skin: Skin is warm and dry. No rash noted.  Psychiatric: She has a normal mood and affect. Her behavior is normal.     ED Treatments / Results  Labs (all labs ordered are listed, but only abnormal results are displayed) Labs Reviewed  BASIC METABOLIC PANEL - Abnormal; Notable for the following components:      Result Value   Glucose, Bld 152 (*)    All other components within normal limits  CBC  I-STAT TROPONIN, ED  I-STAT BETA HCG BLOOD, ED ([facility], [facility], [facility] ONLY)    EKG  EKG Interpretation  Date/Time:  Friday December 30 2016 09:28:07 EST Ventricular Rate:  52 PR Interval:  164 QRS Duration: 68 QT Interval:  402 QTC Calculation: 373 R Axis:   36 Text Interpretation:  Sinus bradycardia Low voltage QRS Cannot rule out Anterior infarct , age undetermined Abnormal ECG Confirmed by Rolland PorterJames, Aashish Hamm ([id number]) on 12/30/2016 11:28:38 AM       Radiology Dg Chest 2 View  Result Date: 12/30/2016 CLINICAL DATA:  Cough and shortness of breath for 1  week EXAM: CHEST  2 VIEW COMPARISON:  08/03/2007 FINDINGS: The heart size and mediastinal contours are within normal limits. Both lungs are clear. The visualized skeletal structures are unremarkable. IMPRESSION: No active cardiopulmonary disease. Electronically Signed   By: Alcide CleverMark  Lukens M.D.   On: 12/30/2016 10:15    Procedures Procedures (including critical care time)  Medications Ordered in ED Medications - No data to display   Initial Impression / Assessment and Plan / ED Course  I have reviewed the triage vital signs and the nursing notes.  Pertinent labs & imaging results that were available during my care of the patient were reviewed by me and considered in my medical decision making (see chart for details).    Normal EKG, normal chest x-ray. Normal labs. Plan for discharge. We'll trial of albuterol inhaler.  Final Clinical Impressions(s) / ED Diagnoses   Final diagnoses:  Cough    ED Discharge  Orders        Ordered    albuterol (PROVENTIL HFA;VENTOLIN HFA) 108 (90 Base) MCG/ACT inhaler  Every 6 hours PRN     12/30/16 1336       Rolland Porter, MD 12/30/16 [phone]

## 2016-12-30 NOTE — ED Triage Notes (Signed)
Per Pt, Pt is coming from home with complaints of cough, SOB, Chest pain that started 1 week. Also reports some tightness in the left arm.

## 2016-12-30 NOTE — Discharge Instructions (Signed)
Use inhaler as needed for your cough.

## 2017-05-08 ENCOUNTER — Other Ambulatory Visit: Payer: Self-pay

## 2017-05-08 ENCOUNTER — Encounter (HOSPITAL_COMMUNITY): Payer: Self-pay | Admitting: Emergency Medicine

## 2017-05-08 ENCOUNTER — Emergency Department (HOSPITAL_COMMUNITY)
Admission: EM | Admit: 2017-05-08 | Discharge: 2017-05-08 | Disposition: A | Discharge status: Home / Self Care | Payer: Self-pay | Attending: Physician Assistant | Admitting: Physician Assistant

## 2017-05-08 DIAGNOSIS — Z87891 Personal history of nicotine dependence: Secondary | ICD-10-CM | POA: Insufficient documentation

## 2017-05-08 DIAGNOSIS — Z7984 Long term (current) use of oral hypoglycemic drugs: Secondary | ICD-10-CM | POA: Insufficient documentation

## 2017-05-08 DIAGNOSIS — M79645 Pain in left finger(s): Secondary | ICD-10-CM | POA: Insufficient documentation

## 2017-05-08 DIAGNOSIS — E119 Type 2 diabetes mellitus without complications: Secondary | ICD-10-CM | POA: Insufficient documentation

## 2017-05-08 DIAGNOSIS — M25561 Pain in right knee: Secondary | ICD-10-CM | POA: Insufficient documentation

## 2017-05-08 DIAGNOSIS — Z79899 Other long term (current) drug therapy: Secondary | ICD-10-CM | POA: Insufficient documentation

## 2017-05-08 DIAGNOSIS — I10 Essential (primary) hypertension: Secondary | ICD-10-CM | POA: Insufficient documentation

## 2017-05-08 DIAGNOSIS — M25562 Pain in left knee: Secondary | ICD-10-CM | POA: Insufficient documentation

## 2017-05-08 DIAGNOSIS — M25542 Pain in joints of left hand: Secondary | ICD-10-CM

## 2017-05-08 DIAGNOSIS — M79644 Pain in right finger(s): Secondary | ICD-10-CM | POA: Insufficient documentation

## 2017-05-08 DIAGNOSIS — M25541 Pain in joints of right hand: Secondary | ICD-10-CM

## 2017-05-08 NOTE — ED Notes (Signed)
Patient not in room for vital signs. Notified provider and stated she probably left after explaining plan of care.

## 2017-05-08 NOTE — ED Provider Notes (Signed)
[facility]   CSN: [id number] Arrival date & time: 05/08/17  0813     History   Chief Complaint Chief Complaint  Patient presents with  . Knee Pain    HPI Ballard RussellBelinda A Roepke is a 57 y.o. female past medical history of hypertension, diabetes, gout, presenting to the ED with multiple weeks of pain to bilateral knees and knuckles to bilateral hands.  Patient states she has had no injury to these joints.  States she wakes up in the morning and they feel stiff, and she has progressive pain throughout the day.  She takes allopurinol daily for gout.  Has also been taking ibuprofen for pain.  Has not followed up with her primary care regarding her complaint today.  No fevers or chills, numbness, or other complaints.  The history is provided by the patient.    Past Medical History:  Diagnosis Date  . Diabetes mellitus without complication (HCC)   . Gout   . High cholesterol   . Hypertension     There are no active problems to display for this patient.   Past Surgical History:  Procedure Laterality Date  . ABDOMINAL HYSTERECTOMY       OB History   None      Home Medications    Prior to Admission medications   Medication Sig Start Date End Date Taking? Authorizing Provider  albuterol (PROVENTIL HFA;VENTOLIN HFA) 108 (90 Base) MCG/ACT inhaler Inhale 1-2 puffs into the lungs every 6 (six) hours as needed for wheezing or shortness of breath. 12/30/16   Rolland PorterJames, Mark, MD  HYDROcodone-acetaminophen (NORCO/VICODIN) 5-325 MG tablet Take 1 tablet by mouth every 4 (four) hours as needed. Patient not taking: Reported on 12/30/2016 10/01/16   Elpidio AnisUpstill, Shari, PA-C  ibuprofen (ADVIL,MOTRIN) 600 MG tablet Take 1 tablet (600 mg total) by mouth every 6 (six) hours as needed. Patient not taking: Reported on 12/30/2016 04/14/14   Lurene ShadowPhelps, Erin O, PA-C  indomethacin (INDOCIN) 25 MG capsule Take 1 capsule (25 mg total) by mouth 3 (three) times daily as  needed. Patient not taking: Reported on 12/30/2016 10/01/16   Elpidio AnisUpstill, Shari, PA-C  metFORMIN (GLUCOPHAGE) 500 MG tablet Take 500 mg by mouth 2 (two) times daily with a meal.    [provider]  ondansetron (ZOFRAN) 4 MG tablet Take 1 tablet (4 mg total) by mouth every 8 (eight) hours as needed for nausea or vomiting. Patient not taking: Reported on 12/30/2016 02/17/15   Ward, Chase PicketJaime Pilcher, PA-C  PRESCRIPTION MEDICATION Take 1 tablet by mouth every morning. BLOOD PRESSURE MEDICATION    [provider]  rosuvastatin (CRESTOR) 40 MG tablet Take 40 mg by mouth every morning.    [provider]  traMADol (ULTRAM) 50 MG tablet Take 1 tablet (50 mg total) by mouth every 6 (six) hours as needed. Patient not taking: Reported on 12/30/2016 04/14/14   Rolla PlatePhelps, Erin O, PA-C    Family History No family history on file.  Social History Social History   Tobacco Use  . Smoking status: Former Smoker    Types: Cigarettes  . Smokeless tobacco: Never Used  Substance Use Topics  . Alcohol use: No  . Drug use: No     Allergies   Patient has no known allergies.   Review of Systems Review of Systems  Constitutional: Negative for fever.  Musculoskeletal: Positive for arthralgias.     Physical Exam Updated Vital Signs BP (!) 165/70 (BP Location: Right Arm)  Pulse 75   Temp 98.2 F (36.8 C) (Oral)   Resp 16   Ht 5\' 1"  (1.549 m)   Wt 81.6 kg (180 lb)   SpO2 98%   BMI 34.01 kg/m   Physical Exam  Constitutional: She appears well-developed and well-nourished. No distress.  HENT:  Head: Normocephalic and atraumatic.  Eyes: Conjunctivae are normal.  Cardiovascular: Normal rate and intact distal pulses.  Pulmonary/Chest: Effort normal.  Musculoskeletal:  Bilateral knees without warmth, erythema, or edema.  Tenderness along bilateral joint lines.  Normal range of motion and normal gait. MCP joints of bilateral hands without edema, erythema, or warmth.  Full normal range  of motion of joints and hands. neurovascularly intact.  Psychiatric: She has a normal mood and affect. Her behavior is normal.  Nursing note and vitals reviewed.    ED Treatments / Results  Labs (all labs ordered are listed, but only abnormal results are displayed) Labs Reviewed - No data to display  EKG None  Radiology No results found.  Procedures Procedures (including critical care time)  Medications Ordered in ED Medications - No data to display   Initial Impression / Assessment and Plan / ED Course  I have reviewed the triage vital signs and the nursing notes.  Pertinent labs & imaging results that were available during my care of the patient were reviewed by me and considered in my medical decision making (see chart for details).     Pt present with aching pain & joint stiffness worsened in the morning. On exam joints were tender but no current medical concern for septic arthritis or gout as pt is afebrile & without warmth of the affected area. Presentation consistent with diagnosis of OA. Pt advised to follow up with her PCP if symptoms persist for further evaluation if conservative therapies do not work. Recommended ice, elevation, exercise, and ibuprofen. Return precaurtions discussed. Safe for discharge.  Discussed results, findings, treatment and follow up. Patient advised of return precautions. Patient verbalized understanding and agreed with plan.  Final Clinical Impressions(s) / ED Diagnoses   Final diagnoses:  Pain in both knees, unspecified chronicity  Joint pain in fingers of both hands    ED Discharge Orders    None       Robinson, [place] N, PA-C 05/08/17 1610    Abelino Derrick, MD 05/08/17 1556

## 2017-05-08 NOTE — ED Triage Notes (Signed)
Pt reports bilateral knee pain with no injury, pt also has history of gout with pain in right knuckle and toes.

## 2017-05-08 NOTE — Discharge Instructions (Signed)
Please read instructions below. Apply ice to your areas of pain for 20 minutes at a time. You can take ibuprofen every 6 hours as needed for pain. Schedule an appointment with your primary care provider to follow up on your visit today. Return to the ER for new or concerning symptoms.

## 2017-07-24 ENCOUNTER — Ambulatory Visit (INDEPENDENT_AMBULATORY_CARE_PROVIDER_SITE_OTHER): Payer: Self-pay | Admitting: Physician Assistant

## 2018-01-27 ENCOUNTER — Encounter (HOSPITAL_COMMUNITY): Payer: Self-pay | Admitting: Emergency Medicine

## 2018-01-27 ENCOUNTER — Emergency Department (HOSPITAL_COMMUNITY)
Admission: EM | Admit: 2018-01-27 | Discharge: 2018-01-27 | Disposition: A | Discharge status: Home / Self Care | Payer: Self-pay | Attending: Emergency Medicine | Admitting: Emergency Medicine

## 2018-01-27 ENCOUNTER — Emergency Department (HOSPITAL_COMMUNITY): Payer: Self-pay

## 2018-01-27 DIAGNOSIS — R1030 Lower abdominal pain, unspecified: Secondary | ICD-10-CM | POA: Insufficient documentation

## 2018-01-27 DIAGNOSIS — N200 Calculus of kidney: Secondary | ICD-10-CM | POA: Insufficient documentation

## 2018-01-27 DIAGNOSIS — Z7982 Long term (current) use of aspirin: Secondary | ICD-10-CM | POA: Insufficient documentation

## 2018-01-27 DIAGNOSIS — I10 Essential (primary) hypertension: Secondary | ICD-10-CM | POA: Insufficient documentation

## 2018-01-27 DIAGNOSIS — E119 Type 2 diabetes mellitus without complications: Secondary | ICD-10-CM | POA: Insufficient documentation

## 2018-01-27 DIAGNOSIS — Z79899 Other long term (current) drug therapy: Secondary | ICD-10-CM | POA: Insufficient documentation

## 2018-01-27 DIAGNOSIS — Z87891 Personal history of nicotine dependence: Secondary | ICD-10-CM | POA: Insufficient documentation

## 2018-01-27 DIAGNOSIS — R109 Unspecified abdominal pain: Secondary | ICD-10-CM

## 2018-01-27 DIAGNOSIS — Z7984 Long term (current) use of oral hypoglycemic drugs: Secondary | ICD-10-CM | POA: Insufficient documentation

## 2018-01-27 LAB — CBC
HEMATOCRIT: 41.8 % (ref 36.0–46.0)
HEMOGLOBIN: 13 g/dL (ref 12.0–15.0)
MCH: 27.8 pg (ref 26.0–34.0)
MCHC: 31.1 g/dL (ref 30.0–36.0)
MCV: 89.5 fL (ref 80.0–100.0)
NRBC: 0 % (ref 0.0–0.2)
Platelets: 253 10*3/uL (ref 150–400)
RBC: 4.67 MIL/uL (ref 3.87–5.11)
RDW: 14.5 % (ref 11.5–15.5)
WBC: 6.4 10*3/uL (ref 4.0–10.5)

## 2018-01-27 LAB — HEPATIC FUNCTION PANEL
ALBUMIN: 3.2 g/dL — AB (ref 3.5–5.0)
ALT: 30 U/L (ref 0–44)
AST: 27 U/L (ref 15–41)
Alkaline Phosphatase: 88 U/L (ref 38–126)
BILIRUBIN TOTAL: 0.3 mg/dL (ref 0.3–1.2)
Bilirubin, Direct: <0.1 mg/dL (ref 0.0–0.2)
Total Protein: 7.4 g/dL (ref 6.5–8.1)

## 2018-01-27 LAB — URINALYSIS, ROUTINE W REFLEX MICROSCOPIC
BILIRUBIN URINE: NEGATIVE
GLUCOSE, UA: NEGATIVE mg/dL
KETONES UR: NEGATIVE mg/dL
LEUKOCYTES UA: NEGATIVE
Nitrite: NEGATIVE
Protein, ur: NEGATIVE mg/dL
Specific Gravity, Urine: 1.018 (ref 1.005–1.030)
pH: 6 (ref 5.0–8.0)

## 2018-01-27 LAB — I-STAT BETA HCG BLOOD, ED (MC, WL, AP ONLY)

## 2018-01-27 LAB — BASIC METABOLIC PANEL
Anion gap: 5 (ref 5–15)
BUN: 10 mg/dL (ref 6–20)
CALCIUM: 10.2 mg/dL (ref 8.9–10.3)
CO2: 26 mmol/L (ref 22–32)
CREATININE: 0.97 mg/dL (ref 0.44–1.00)
Chloride: 110 mmol/L (ref 98–111)
GLUCOSE: 118 mg/dL — AB (ref 70–99)
POTASSIUM: 3.9 mmol/L (ref 3.5–5.1)
SODIUM: 141 mmol/L (ref 135–145)

## 2018-01-27 LAB — LIPASE, BLOOD: Lipase: 28 U/L (ref 11–51)

## 2018-01-27 MED ORDER — OXYCODONE-ACETAMINOPHEN 5-325 MG PO TABS
1.0000 | ORAL_TABLET | Freq: Once | ORAL | Status: AC
  Administered 2018-01-27: 1 via ORAL
  Filled 2018-01-27: qty 1

## 2018-01-27 MED ORDER — ONDANSETRON HCL 4 MG PO TABS: 4.0000 mg | ORAL_TABLET | ORAL | Status: DC | PRN

## 2018-01-27 MED ORDER — OXYCODONE-ACETAMINOPHEN 5-325 MG PO TABS: 1.0000 | ORAL_TABLET | Freq: Four times a day (QID) | ORAL | 0 refills | Status: AC | PRN

## 2018-01-27 NOTE — ED Triage Notes (Signed)
Pt states she has had right flank pain, lower abd cramping for approx a week. Pt complains of pressure and feeling like she needs to urinate. Pt states she has burning and pressure in her vaginal as well.

## 2018-01-27 NOTE — Discharge Instructions (Signed)
CT scan showed a very small stone on the right side that you should pass in a few days. If symptoms do not improve in the next few days you should try to make an appointment with Urology.

## 2018-01-27 NOTE — ED Notes (Signed)
Patient verbalizes understanding of discharge instructions. Opportunity for questioning and answers were provided. Armband removed by staff, pt discharged from ED.  

## 2018-01-27 NOTE — ED Provider Notes (Signed)
[facility] Provider [facility]   CSN: [id number] Arrival date & time: 01/27/18  1344     History   Chief Complaint Chief Complaint  Patient presents with  . Flank Pain    HPI Rachel Brock is a 58 y.o. female with a past medical history significant for HTN, T2DM, HLD, gout who presents today complaining of right flank pain and lower abdominal cramping. Patient reports that she has had right flank pain for about a week. She describe the pain severe and brought her down to her knee. She also reports burning with urination, increase frequency and hesitancy. Patient was seen by PCP on Friday when a UA was done and did not show any infection. Patient was told to go to the ED if symptoms persisted. She reports some nausea associated with her symptoms but denies any vomiting, fever, chills, chest pain, sob, vaginal discharge or hematuria. Patient does have a history of kidney stones.   Past Medical History:  Diagnosis Date  . Diabetes mellitus without complication (HCC)   . Gout   . High cholesterol   . Hypertension     There are no active problems to display for this patient.   Past Surgical History:  Procedure Laterality Date  . ABDOMINAL HYSTERECTOMY       OB History   No obstetric history on file.      Home Medications    Prior to Admission medications   Medication Sig Start Date End Date Taking? Authorizing Provider  AMLODIPINE BESYLATE PO Take 1 tablet by mouth daily.   Yes [provider]  aspirin EC 81 MG tablet Take 81 mg by mouth daily.   Yes [provider]  LOSARTAN POTASSIUM PO Take 1 tablet by mouth daily.   Yes [provider]  metFORMIN (GLUCOPHAGE) 500 MG tablet Take 500 mg by mouth 2 (two) times daily with a meal.   Yes [provider]  rosuvastatin (CRESTOR) 40 MG tablet Take 40 mg by mouth every morning.   Yes [provider]  albuterol (PROVENTIL HFA;VENTOLIN HFA) 108 (90  Base) MCG/ACT inhaler Inhale 1-2 puffs into the lungs every 6 (six) hours as needed for wheezing or shortness of breath. Patient not taking: Reported on 01/27/2018 12/30/16   Rolland PorterJames, Mark, MD  HYDROcodone-acetaminophen (NORCO/VICODIN) 5-325 MG tablet Take 1 tablet by mouth every 4 (four) hours as needed. Patient not taking: Reported on 12/30/2016 10/01/16   Elpidio AnisUpstill, Shari, PA-C  ibuprofen (ADVIL,MOTRIN) 600 MG tablet Take 1 tablet (600 mg total) by mouth every 6 (six) hours as needed. Patient not taking: Reported on 12/30/2016 04/14/14   Lurene ShadowPhelps, Erin O, PA-C  indomethacin (INDOCIN) 25 MG capsule Take 1 capsule (25 mg total) by mouth 3 (three) times daily as needed. Patient not taking: Reported on 12/30/2016 10/01/16   Elpidio AnisUpstill, Shari, PA-C  ondansetron (ZOFRAN) 4 MG tablet Take 1 tablet (4 mg total) by mouth every 8 (eight) hours as needed for nausea or vomiting. Patient not taking: Reported on 12/30/2016 02/17/15   Ward, Chase PicketJaime Pilcher, PA-C  oxyCODONE-acetaminophen (PERCOCET) 5-325 MG tablet Take 1 tablet by mouth every 6 (six) hours as needed for up to 5 days for severe pain. 01/27/18 02/01/18  Rolland Steinert, Lilia ArgueAbdoulaye, MD  traMADol (ULTRAM) 50 MG tablet Take 1 tablet (50 mg total) by mouth every 6 (six) hours as needed. Patient not taking: Reported on 12/30/2016 04/14/14   Rolla PlatePhelps, Erin O, PA-C    Family History History reviewed. No pertinent family  history.  Social History Social History   Tobacco Use  . Smoking status: Former Smoker    Types: Cigarettes  . Smokeless tobacco: Never Used  Substance Use Topics  . Alcohol use: No  . Drug use: No     Allergies   Patient has no known allergies.   Review of Systems Review of Systems  Constitutional: Negative.   HENT: Negative.   Eyes: Negative.   Respiratory: Negative.   Cardiovascular: Negative.   Gastrointestinal: Positive for nausea.  Endocrine: Negative.   Genitourinary: Positive for difficulty urinating, dysuria, frequency and urgency. Negative for  hematuria.  Musculoskeletal: Negative.   Allergic/Immunologic: Negative.   Neurological: Negative.   Hematological: Negative.     Physical Exam Updated Vital Signs BP (!) 154/77 (BP Location: Left Arm)   Pulse 61   Temp 98.2 F (36.8 C) (Oral)   Resp 16   Ht 5\' 1"  (1.549 m)   Wt 81.6 kg   SpO2 100%   BMI 34.01 kg/m   Physical Exam Constitutional:      Appearance: Normal appearance.  HENT:     Head: Normocephalic and atraumatic.     Nose: Nose normal.     Mouth/Throat:     Mouth: Mucous membranes are moist.  Eyes:     Extraocular Movements: Extraocular movements intact.     Conjunctiva/sclera: Conjunctivae normal.     Pupils: Pupils are equal, round, and reactive to light.  Neck:     Musculoskeletal: Normal range of motion and neck supple.  Cardiovascular:     Rate and Rhythm: Normal rate.  Pulmonary:     Effort: Pulmonary effort is normal.     Breath sounds: Normal breath sounds.  Abdominal:     General: Abdomen is flat.     Tenderness: There is no right CVA tenderness or left CVA tenderness.     Comments: Right flank tenderness with palpation  Musculoskeletal: Normal range of motion.  Neurological:     Mental Status: She is alert.      ED Treatments / Results  Labs (all labs ordered are listed, but only abnormal results are displayed) Labs Reviewed  URINALYSIS, ROUTINE W REFLEX MICROSCOPIC - Abnormal; Notable for the following components:      Result Value   APPearance HAZY (*)    Hgb urine dipstick LARGE (*)    RBC / HPF >50 (*)    Bacteria, UA RARE (*)    All other components within normal limits  BASIC METABOLIC PANEL - Abnormal; Notable for the following components:   Glucose, Bld 118 (*)    All other components within normal limits  HEPATIC FUNCTION PANEL - Abnormal; Notable for the following components:   Albumin 3.2 (*)    All other components within normal limits  CBC  LIPASE, BLOOD  I-STAT BETA HCG BLOOD, ED ([facility], [facility], [facility] ONLY)     EKG None  Radiology Ct Renal Stone Study  Result Date: 01/27/2018 CLINICAL DATA:  RIGHT flank pain, lower abdominal pain for a week. Vaginal pain and pressure. History of hysterectomy and cholelithiasis. EXAM: CT ABDOMEN AND PELVIS WITHOUT CONTRAST TECHNIQUE: Multidetector CT imaging of the abdomen and pelvis was performed following the standard protocol without IV contrast. COMPARISON:  None. FINDINGS: LOWER CHEST: Lung bases are clear. The visualized heart size is normal. No pericardial effusion. HEPATOBILIARY: Punctate dependent gallstone without CT findings of acute cholecystitis. Negative non-contrast CT liver. PANCREAS: Normal. SPLEEN: Normal. ADRENALS/URINARY TRACT: Kidneys are orthotopic, demonstrating normal size and morphology.  No nephrolithiasis, hydronephrosis; limited assessment for renal masses by nonenhanced CT. The unopacified ureters are normal in course and caliber, punctate calculus RIGHT ureterovesicular junction. Urinary bladder is adequately distended and unremarkable. Normal adrenal glands. STOMACH/BOWEL: The stomach, small and large bowel are normal in course and caliber without inflammatory changes, sensitivity decreased by lack of enteric contrast. Mild sigmoid colonic diverticulosis. Normal appendix. VASCULAR/LYMPHATIC: Aortoiliac vessels are normal in course and caliber. Mild calcific atherosclerosis. No lymphadenopathy by CT size criteria. REPRODUCTIVE: Status post hysterectomy. OTHER: No intraperitoneal free fluid or free air. Small fat containing umbilical hernia. MUSCULOSKELETAL: Non-acute. IMPRESSION: 1. Punctate non-obstructing RIGHT ureterovesicular junction calculus. 2. Cholelithiasis without CT findings of acute cholecystitis. Aortic Atherosclerosis (ICD10-I70.0). Electronically Signed   By: Awilda Metro M.D.   On: 01/27/2018 19:00    Procedures Procedures (including critical care time)  Medications Ordered in ED Medications  ondansetron (ZOFRAN) tablet 4  mg (has no administration in time range)  oxyCODONE-acetaminophen (PERCOCET/ROXICET) 5-325 MG per tablet 1 tablet (1 tablet Oral Given 01/27/18 1946)     Initial Impression / Assessment and Plan / ED Course  I have reviewed the triage vital signs and the nursing notes.  Pertinent labs & imaging results that were available during my care of the patient were reviewed by me and considered in my medical decision making (see chart for details).    Patient is a 58 yo female who presents with right flank pain associated with dysuria, hesitancy, increase frequency. No fever, chills other signs concerning for systemic infection, however patient endorses nausea. On exam patient does not have CVA tenderness but is tender to palpation. Patient does have a history of kidney stone. Recent UA at PCP office was negative for UTI however given worsening symptoms we repeated UA which showed hemoglobin but no leukocytes or nitrites. CT study for renal stone showed punctate non obstructing right renal stone with no signs of hydronephrosis. Given negative UA and imaging finding discuss patient will be discharged home with pain meds with the expectation that she will pass the stone in the upcoming days. She was instructed to contact Urology if symptoms do not improve in the next few days for further evaluation. Patient was in agreement with plan and verbalized understanding. Patient was stable on discharge.   Final Clinical Impressions(s) / ED Diagnoses   Final diagnoses:  Kidney stone  Flank pain    ED Discharge Orders         Ordered    oxyCODONE-acetaminophen (PERCOCET) 5-325 MG tablet  Every 6 hours PRN     01/27/18 1954           Lovena Neighbours, MD 01/27/18 2046    Blane Ohara, MD 01/28/18 [phone]

## 2018-02-07 ENCOUNTER — Other Ambulatory Visit (HOSPITAL_COMMUNITY): Payer: Self-pay | Admitting: *Deleted

## 2018-02-07 DIAGNOSIS — Z1231 Encounter for screening mammogram for malignant neoplasm of breast: Secondary | ICD-10-CM

## 2018-05-03 ENCOUNTER — Ambulatory Visit (HOSPITAL_COMMUNITY): Payer: Self-pay

## 2018-08-21 ENCOUNTER — Ambulatory Visit (HOSPITAL_COMMUNITY): Payer: Self-pay

## 2018-11-09 ENCOUNTER — Other Ambulatory Visit: Payer: Self-pay

## 2018-11-09 DIAGNOSIS — Z20822 Contact with and (suspected) exposure to covid-19: Secondary | ICD-10-CM

## 2018-11-10 ENCOUNTER — Telehealth: Payer: Self-pay

## 2018-11-10 NOTE — Telephone Encounter (Signed)
Pt called for covid results- advised results are not back. Sent pt link to sign up for MyChart.

## 2018-11-11 LAB — NOVEL CORONAVIRUS, NAA: SARS-CoV-2, NAA: NOT DETECTED

## 2018-11-12 ENCOUNTER — Telehealth: Payer: Self-pay | Admitting: Nurse Practitioner

## 2018-11-12 NOTE — Telephone Encounter (Signed)
Pt aware covid lab test negative, not detected °

## 2019-02-26 ENCOUNTER — Other Ambulatory Visit: Payer: Self-pay

## 2019-05-02 ENCOUNTER — Other Ambulatory Visit: Payer: Self-pay

## 2019-05-02 ENCOUNTER — Emergency Department (HOSPITAL_COMMUNITY)
Admission: EM | Admit: 2019-05-02 | Discharge: 2019-05-02 | Disposition: A | Discharge status: Home / Self Care | Payer: Self-pay | Attending: Emergency Medicine | Admitting: Emergency Medicine

## 2019-05-02 ENCOUNTER — Encounter (HOSPITAL_COMMUNITY): Payer: Self-pay | Admitting: Emergency Medicine

## 2019-05-02 DIAGNOSIS — Z5321 Procedure and treatment not carried out due to patient leaving prior to being seen by health care provider: Secondary | ICD-10-CM | POA: Insufficient documentation

## 2019-05-02 DIAGNOSIS — R1030 Lower abdominal pain, unspecified: Secondary | ICD-10-CM | POA: Insufficient documentation

## 2019-05-02 LAB — URINALYSIS, ROUTINE W REFLEX MICROSCOPIC
Bilirubin Urine: NEGATIVE
Glucose, UA: NEGATIVE mg/dL
Ketones, ur: NEGATIVE mg/dL
Leukocytes,Ua: NEGATIVE
Nitrite: NEGATIVE
Protein, ur: NEGATIVE mg/dL
Specific Gravity, Urine: 1.015 (ref 1.005–1.030)
pH: 5 (ref 5.0–8.0)

## 2019-05-02 LAB — CBG MONITORING, ED: Glucose-Capillary: 104 mg/dL — ABNORMAL HIGH (ref 70–99)

## 2019-05-02 NOTE — ED Triage Notes (Signed)
Pt flank pain since Monday. Reports urine is really dark yellow. Will have intermittent severe pains. Reports ibuprofen not helping. Sometimes heat will help. Tried cranberry juice without any relief.

## 2019-05-06 ENCOUNTER — Other Ambulatory Visit: Payer: Self-pay

## 2019-05-06 ENCOUNTER — Emergency Department (HOSPITAL_COMMUNITY)
Admission: EM | Admit: 2019-05-06 | Discharge: 2019-05-06 | Disposition: A | Discharge status: Home / Self Care | Payer: Self-pay | Attending: Emergency Medicine | Admitting: Emergency Medicine

## 2019-05-06 ENCOUNTER — Encounter (HOSPITAL_COMMUNITY): Payer: Self-pay

## 2019-05-06 ENCOUNTER — Emergency Department (HOSPITAL_COMMUNITY): Payer: Self-pay

## 2019-05-06 DIAGNOSIS — K8020 Calculus of gallbladder without cholecystitis without obstruction: Secondary | ICD-10-CM | POA: Insufficient documentation

## 2019-05-06 DIAGNOSIS — N200 Calculus of kidney: Secondary | ICD-10-CM | POA: Insufficient documentation

## 2019-05-06 LAB — COMPREHENSIVE METABOLIC PANEL
ALT: 19 U/L (ref 0–44)
AST: 20 U/L (ref 15–41)
Albumin: 3.3 g/dL — ABNORMAL LOW (ref 3.5–5.0)
Alkaline Phosphatase: 83 U/L (ref 38–126)
Anion gap: 7 (ref 5–15)
BUN: 10 mg/dL (ref 6–20)
CO2: 26 mmol/L (ref 22–32)
Calcium: 10 mg/dL (ref 8.9–10.3)
Chloride: 107 mmol/L (ref 98–111)
Creatinine, Ser: 0.94 mg/dL (ref 0.44–1.00)
GFR calc Af Amer: >60 mL/min (ref >60)
GFR calc non Af Amer: >60 mL/min (ref >60)
Glucose, Bld: 109 mg/dL — ABNORMAL HIGH (ref 70–99)
Potassium: 4.1 mmol/L (ref 3.5–5.1)
Sodium: 140 mmol/L (ref 135–145)
Total Bilirubin: 0.4 mg/dL (ref 0.3–1.2)
Total Protein: 7.1 g/dL (ref 6.5–8.1)

## 2019-05-06 LAB — CBC WITH DIFFERENTIAL/PLATELET
Abs Immature Granulocytes: 0.02 10*3/uL (ref 0.00–0.07)
Basophils Absolute: 0 10*3/uL (ref 0.0–0.1)
Basophils Relative: 1 %
Eosinophils Absolute: 0.1 10*3/uL (ref 0.0–0.5)
Eosinophils Relative: 1 %
HCT: 43.4 % (ref 36.0–46.0)
Hemoglobin: 13.6 g/dL (ref 12.0–15.0)
Immature Granulocytes: 0 %
Lymphocytes Relative: 25 %
Lymphs Abs: 1.3 10*3/uL (ref 0.7–4.0)
MCH: 28.8 pg (ref 26.0–34.0)
MCHC: 31.3 g/dL (ref 30.0–36.0)
MCV: 91.8 fL (ref 80.0–100.0)
Monocytes Absolute: 0.4 10*3/uL (ref 0.1–1.0)
Monocytes Relative: 8 %
Neutro Abs: 3.3 10*3/uL (ref 1.7–7.7)
Neutrophils Relative %: 65 %
Platelets: 281 10*3/uL (ref 150–400)
RBC: 4.73 MIL/uL (ref 3.87–5.11)
RDW: 14.4 % (ref 11.5–15.5)
WBC: 5.2 10*3/uL (ref 4.0–10.5)
nRBC: 0 % (ref 0.0–0.2)

## 2019-05-06 LAB — URINALYSIS, ROUTINE W REFLEX MICROSCOPIC
Bilirubin Urine: NEGATIVE
Glucose, UA: NEGATIVE mg/dL
Ketones, ur: NEGATIVE mg/dL
Leukocytes,Ua: NEGATIVE
Nitrite: NEGATIVE
Protein, ur: NEGATIVE mg/dL
Specific Gravity, Urine: 1.013 (ref 1.005–1.030)
pH: 5 (ref 5.0–8.0)

## 2019-05-06 MED ORDER — OXYCODONE-ACETAMINOPHEN 5-325 MG PO TABS: 1.0000 | ORAL_TABLET | Freq: Four times a day (QID) | ORAL | 0 refills | Status: DC | PRN

## 2019-05-06 MED ORDER — ONDANSETRON 4 MG PO TBDP: 4.0000 mg | ORAL_TABLET | Freq: Three times a day (TID) | ORAL | 0 refills | Status: AC | PRN

## 2019-05-06 MED ORDER — CEPHALEXIN 500 MG PO CAPS: 500.0000 mg | ORAL_CAPSULE | Freq: Two times a day (BID) | ORAL | 0 refills | Status: AC

## 2019-05-06 MED ORDER — TAMSULOSIN HCL 0.4 MG PO CAPS: 0.4000 mg | ORAL_CAPSULE | Freq: Every day | ORAL | 0 refills | Status: AC

## 2019-05-06 MED ORDER — KETOROLAC TROMETHAMINE 30 MG/ML IJ SOLN
15.0000 mg | Freq: Once | INTRAMUSCULAR | Status: AC
  Administered 2019-05-06: 15 mg via INTRAVENOUS
  Filled 2019-05-06: qty 1

## 2019-05-06 MED ORDER — SODIUM CHLORIDE 0.9 % IV BOLUS
1000.0000 mL | Freq: Once | INTRAVENOUS | Status: AC
  Administered 2019-05-06: 1000 mL via INTRAVENOUS

## 2019-05-06 MED ORDER — ONDANSETRON HCL 4 MG/2ML IJ SOLN
4.0000 mg | Freq: Once | INTRAMUSCULAR | Status: AC
  Administered 2019-05-06: 4 mg via INTRAVENOUS
  Filled 2019-05-06: qty 2

## 2019-05-06 MED ORDER — SODIUM CHLORIDE 0.9 % IV SOLN
1.0000 g | Freq: Once | INTRAVENOUS | Status: AC
  Administered 2019-05-06: 1 g via INTRAVENOUS
  Filled 2019-05-06: qty 10

## 2019-05-06 NOTE — ED Notes (Signed)
Pt ambulated to rr without difficulty

## 2019-05-06 NOTE — ED Provider Notes (Signed)
[facility] EMERGENCY DEPARTMENT Provider Note   CSN: [id number] Arrival date & time: 05/06/19  1143     History No chief complaint on file.   Rachel Brock is a 59 y.o. female.  HPI      Rachel Brock is a 59 y.o. female, with a history of DM, gout, hypercholesterolemia, HTN, hysterectomy, presenting to the ED primarily complaining of left lower back pain beginning this morning. Pain is waxing and waning, aching, then sharp, currently 7/10, radiating into the left flank.  She states she had similar pain in the past with kidney stone. She notes several days of urinary frequency, urgency, and suprapubic pressure.  She has had similar symptoms in the past with UTI. She also mentions pressure in the vaginal region for the last several days. Last bowel movement was yesterday and was firm. Denies fever/chills, N/V/C/D, abdominal pain, hematuria, dysuria, protrusion from the vagina, vaginal bleeding/discharge, difficulty with bowel movements, hematochezia/melena, or any other complaints.  Her OBGYN is Dr. Reggie Pile.  Past Medical History:  Diagnosis Date  . Diabetes mellitus without complication (HCC)   . Gout   . High cholesterol   . Hypertension     There are no problems to display for this patient.   Past Surgical History:  Procedure Laterality Date  . ABDOMINAL HYSTERECTOMY       OB History   No obstetric history on file.     No family history on file.  Social History   Tobacco Use  . Smoking status: Former Smoker    Types: Cigarettes  . Smokeless tobacco: Never Used  Substance Use Topics  . Alcohol use: No  . Drug use: No    Home Medications Prior to Admission medications   Medication Sig Start Date End Date Taking? Authorizing Provider  albuterol (PROVENTIL HFA;VENTOLIN HFA) 108 (90 Base) MCG/ACT inhaler Inhale 1-2 puffs into the lungs every 6 (six) hours as needed for wheezing or shortness of breath. Patient not taking: Reported on  01/27/2018 12/30/16   Rolland Porter, MD  AMLODIPINE BESYLATE PO Take 1 tablet by mouth daily.    [provider]  aspirin EC 81 MG tablet Take 81 mg by mouth daily.    [provider]  cephALEXin (KEFLEX) 500 MG capsule Take 1 capsule (500 mg total) by mouth 2 (two) times daily for 5 days. 05/06/19 05/11/19  Coden Franchi, Hillard Danker, PA-C  HYDROcodone-acetaminophen (NORCO/VICODIN) 5-325 MG tablet Take 1 tablet by mouth every 4 (four) hours as needed. Patient not taking: Reported on 12/30/2016 10/01/16   Elpidio Anis, PA-C  ibuprofen (ADVIL,MOTRIN) 600 MG tablet Take 1 tablet (600 mg total) by mouth every 6 (six) hours as needed. Patient not taking: Reported on 12/30/2016 04/14/14   Lurene Shadow, PA-C  indomethacin (INDOCIN) 25 MG capsule Take 1 capsule (25 mg total) by mouth 3 (three) times daily as needed. Patient not taking: Reported on 12/30/2016 10/01/16   Elpidio Anis, PA-C  LOSARTAN POTASSIUM PO Take 1 tablet by mouth daily.    [provider]  metFORMIN (GLUCOPHAGE) 500 MG tablet Take 500 mg by mouth 2 (two) times daily with a meal.    [provider]  ondansetron (ZOFRAN ODT) 4 MG disintegrating tablet Take 1 tablet (4 mg total) by mouth every 8 (eight) hours as needed for nausea or vomiting. 05/06/19   Delano Frate C, PA-C  ondansetron (ZOFRAN) 4 MG tablet Take 1 tablet (4 mg total) by mouth every 8 (eight) hours as  needed for nausea or vomiting. Patient not taking: Reported on 12/30/2016 02/17/15   Ward, Chase Picket, PA-C  oxyCODONE-acetaminophen (PERCOCET/ROXICET) 5-325 MG tablet Take 1-2 tablets by mouth every 6 (six) hours as needed for severe pain. 05/06/19   Nixie Laube C, PA-C  rosuvastatin (CRESTOR) 40 MG tablet Take 40 mg by mouth every morning.    [provider]  tamsulosin (FLOMAX) 0.4 MG CAPS capsule Take 1 capsule (0.4 mg total) by mouth daily. 05/06/19   Daci Stubbe C, PA-C  traMADol (ULTRAM) 50 MG tablet Take 1 tablet (50 mg total) by mouth every 6 (six)  hours as needed. Patient not taking: Reported on 12/30/2016 04/14/14   Lurene Shadow, PA-C    Allergies    Patient has no known allergies.  Review of Systems   Review of Systems  Constitutional: Negative for chills, diaphoresis and fever.  Respiratory: Negative for shortness of breath.   Cardiovascular: Negative for chest pain.  Gastrointestinal: Negative for abdominal pain, blood in stool, constipation, diarrhea, nausea and vomiting.  Genitourinary: Positive for flank pain, frequency and urgency. Negative for difficulty urinating, dysuria and hematuria.  Musculoskeletal: Positive for back pain.  Neurological: Negative for weakness and numbness.  All other systems reviewed and are negative.   Physical Exam Updated Vital Signs BP (!) 165/72 (BP Location: Left Arm)   Pulse 62   Temp 98.2 F (36.8 C) (Oral)   Resp 17   Ht 5\' 1"  (1.549 m)   Wt 81.6 kg   SpO2 100%   BMI 34.01 kg/m   Physical Exam Vitals and nursing note reviewed.  Constitutional:      General: She is not in acute distress.    Appearance: She is well-developed. She is not diaphoretic.  HENT:     Head: Normocephalic and atraumatic.     Mouth/Throat:     Mouth: Mucous membranes are moist.     Pharynx: Oropharynx is clear.  Eyes:     Conjunctiva/sclera: Conjunctivae normal.  Cardiovascular:     Rate and Rhythm: Normal rate and regular rhythm.     Pulses: Normal pulses.          Radial pulses are 2+ on the right side and 2+ on the left side.       Posterior tibial pulses are 2+ on the right side and 2+ on the left side.     Heart sounds: Normal heart sounds.     Comments: Tactile temperature in the extremities appropriate and equal bilaterally. Pulmonary:     Effort: Pulmonary effort is normal. No respiratory distress.     Breath sounds: Normal breath sounds.  Abdominal:     Palpations: Abdomen is soft.     Tenderness: There is no abdominal tenderness. There is no right CVA tenderness, left CVA tenderness  or guarding.  Musculoskeletal:     Cervical back: Neck supple.     Right lower leg: No edema.     Left lower leg: No edema.  Lymphadenopathy:     Cervical: No cervical adenopathy.  Skin:    General: Skin is warm and dry.  Neurological:     Mental Status: She is alert.     Comments: Sensation grossly intact to light touch in the lower extremities bilaterally. No saddle anesthesias. Strength 5/5 in the bilateral lower extremities. No noted gait deficit.  Psychiatric:        Mood and Affect: Mood and affect normal.        Speech: Speech normal.  Behavior: Behavior normal.     ED Results / Procedures / Treatments   Labs (all labs ordered are listed, but only abnormal results are displayed) Labs Reviewed  URINALYSIS, ROUTINE W REFLEX MICROSCOPIC - Abnormal; Notable for the following components:      Result Value   Hgb urine dipstick MODERATE (*)    Bacteria, UA FEW (*)    All other components within normal limits  COMPREHENSIVE METABOLIC PANEL - Abnormal; Notable for the following components:   Glucose, Bld 109 (*)    Albumin 3.3 (*)    All other components within normal limits  URINE CULTURE  CBC WITH DIFFERENTIAL/PLATELET    EKG None  Radiology CT Renal Stone Study  Result Date: 05/06/2019 CLINICAL DATA:  Bilateral flank pain for 1 week EXAM: CT ABDOMEN AND PELVIS WITHOUT CONTRAST TECHNIQUE: Multidetector CT imaging of the abdomen and pelvis was performed following the standard protocol without IV contrast. COMPARISON:  01/27/2018 FINDINGS: Lower chest: No acute abnormality. Hepatobiliary: No focal liver abnormality. Small hyperdense structure in the dependent portion of the gallbladder, likely a small gallstone. No CT evidence for cholecystitis. No biliary dilatation. Pancreas: Unremarkable. No pancreatic ductal dilatation or surrounding inflammatory changes. Spleen: Normal in size without focal abnormality. Adrenals/Urinary Tract: Unremarkable adrenal glands. Tiny 1-2  mm nonobstructing stone within the midpole of the left kidney. No right renal calculus is identified. No hydronephrosis. There is a tiny 1-2 mm calcification in the region of the left ureterovesical junction suspicious for a small nonobstructing stone. Urinary bladder is otherwise unremarkable. Stomach/Bowel: Small hiatal hernia. Stomach is otherwise within normal limits. Appendix appears normal. No evidence of bowel wall thickening, distention, or inflammatory changes. Vascular/Lymphatic: Mild scattered aortoiliac atherosclerosis. No aneurysm. No abdominopelvic lymphadenopathy. Reproductive: Status post hysterectomy. No adnexal masses. Other: No abdominal wall hernia or abnormality. No abdominopelvic ascites. Musculoskeletal: No acute or significant osseous findings. IMPRESSION: 1. There is a tiny 1-2 mm calcification in the region of the left ureterovesical junction suspicious for a small nonobstructing stone. Additional tiny nonobstructing stone within the midpole of the left kidney. No hydronephrosis. 2. Cholelithiasis without evidence for cholecystitis. 3. Small hiatal hernia. 4. Aortic atherosclerosis. Electronically Signed   By: Davina Poke D.O.   On: 05/06/2019 17:07    Procedures Procedures (including critical care time)  Medications Ordered in ED Medications  sodium chloride 0.9 % bolus 1,000 mL (0 mLs Intravenous Stopped 05/06/19 1737)  ketorolac (TORADOL) 30 MG/ML injection 15 mg (15 mg Intravenous Given 05/06/19 1523)  ondansetron (ZOFRAN) injection 4 mg (4 mg Intravenous Given 05/06/19 1523)  cefTRIAXone (ROCEPHIN) 1 g in sodium chloride 0.9 % 100 mL IVPB (0 g Intravenous Stopped 05/06/19 1616)    ED Course  I have reviewed the triage vital signs and the nursing notes.  Pertinent labs & imaging results that were available during my care of the patient were reviewed by me and considered in my medical decision making (see chart for details).  Clinical Course as of May 06 1248  Mon  May 06, 2019  1559 Patient states her pain has improved.   [SJ]    Clinical Course User Index [SJ] Alphus Zeck, Helane Gunther, PA-C   MDM Rules/Calculators/A&P                      Patient presents primarily complaining of left lower back and flank pain, consistent with renal colic. Patient is nontoxic appearing, afebrile, not tachycardic, not tachypneic, not hypotensive, maintains excellent SPO2 on room air,  and is in no apparent distress.   I have reviewed the patient's chart to obtain more information.  I reviewed and interpreted the patient's labs and radiological studies. Some hematuria on UA, few bacteria, but no leukocytes, nitrites.  Urine culture pending. CT with small stone at the UVJ, suspected to be the cause of the patient's pain.  Patient's pain well controlled during ED course.  My suspicion for a truly infected stone or pyelonephritis is low.  Patient was only started on antibiotic due to her insistence that she has had UTI with some similar symptoms in the past and they were started more so as a precaution based on this history element.  In regards to the patient's vaginal pressure, a pelvic exam was suggested as rectal or bladder prolapse is a possible reason for this complaint.  This was all explained to the patient.  She considered this information and ultimately declined pelvic exam.  As an alternative, she will follow-up with her OB/GYN this week.   The patient was given instructions for home care as well as return precautions. Patient voices understanding of these instructions, accepts the plan, and is comfortable with discharge.  Vitals:   05/06/19 1214 05/06/19 1734  BP: (!) 165/72 136/64  Pulse: 62 (!) 56  Resp: 17 16  Temp: 98.2 F (36.8 C)   TempSrc: Oral   SpO2: 100% 100%  Weight: 81.6 kg   Height: 5\' 1"  (1.549 m)      Final Clinical Impression(s) / ED Diagnoses Final diagnoses:  Left renal stone    Rx / DC Orders ED Discharge Orders         Ordered     cephALEXin (KEFLEX) 500 MG capsule  2 times daily     05/06/19 1733    tamsulosin (FLOMAX) 0.4 MG CAPS capsule  Daily     05/06/19 1735    oxyCODONE-acetaminophen (PERCOCET/ROXICET) 5-325 MG tablet  Every 6 hours PRN     05/06/19 1735    ondansetron (ZOFRAN ODT) 4 MG disintegrating tablet  Every 8 hours PRN     05/06/19 1735           07/06/19, PA-C 05/07/19 1252    05/09/19, MD 05/08/19 0800

## 2019-05-06 NOTE — ED Notes (Signed)
Patient given discharge instructions patient verbalizes understanding. 

## 2019-05-06 NOTE — ED Triage Notes (Signed)
Patient has bilateral flank pain, bladder and vaginal pressure with frequent urination. Reports symptoms x 1 week

## 2019-05-06 NOTE — Discharge Instructions (Addendum)
  Kidney Stone There is evidence of a kidney stone on the left side.  It appears as though it is on its way out.  Some kidney stones can take up to 30 days to pass. Hydration: Hydration is key to helping a kidney stone pass.  Have a goal of half a liter of water every hour or two. Antiinflammatory medications: Take 600 mg of ibuprofen every 6 hours or 440 mg (over the counter dose) to 500 mg (prescription dose) of naproxen every 12 hours for the next 3 days. After this time, these medications may be used as needed for pain. Take these medications with food to avoid upset stomach. Choose only one of these medications, do not take them together. Acetaminophen: Should you continue to have additional pain while taking the ibuprofen or naproxen, you may add in acetaminophen (generic for Tylenol) as needed. Your daily total maximum amount of acetaminophen from all sources should be limited to 4000mg /day for persons without liver problems, or 2000mg /day for those with liver problems. Percocet: May take Percocet (oxycodone-acetaminophen) as needed for severe pain.   Do not drive or perform other dangerous activities while taking this medication as it can cause drowsiness as well as changes in reaction time and judgement.  Please note that each pill of Percocet contains 325 mg of acetaminophen (generic for Tylenol) and the above dosage limits apply. Tamsulosin: This medication is designed to help the stone pass.  Take this medication daily until stone passes. Nausea/vomiting: Use the ondansetron (generic for Zofran) for nausea or vomiting.  This medication may not prevent all vomiting or nausea, but can help facilitate better hydration. Things that can help with nausea/vomiting also include peppermint/menthol candies, vitamin B12, and ginger. Follow-up: Follow-up with the urologist as soon as possible on this matter. Return: Return to the ED for significantly increased pain, difficulty urinating, pain with  urination, fever, uncontrolled vomiting, or any other major concerns.  For prescription assistance, may try using prescription discount sites or apps, such as goodrx.com  There was not definite signs of infection on the urinalysis, however, we will start antibiotics based on your suspicion that you may have UTI.  Some of your symptoms that could be associated with UTI could also be due to the kidney stone.  Furthermore, we recommend follow-up with OB/GYN for the pelvic pressure.

## 2019-05-08 LAB — URINE CULTURE

## 2019-08-05 ENCOUNTER — Emergency Department (HOSPITAL_COMMUNITY)
Admission: EM | Admit: 2019-08-05 | Discharge: 2019-08-06 | Disposition: A | Discharge status: Home / Self Care | Payer: Self-pay | Attending: Emergency Medicine | Admitting: Emergency Medicine

## 2019-08-05 ENCOUNTER — Other Ambulatory Visit: Payer: Self-pay

## 2019-08-05 ENCOUNTER — Encounter (HOSPITAL_COMMUNITY): Payer: Self-pay

## 2019-08-05 DIAGNOSIS — Z5321 Procedure and treatment not carried out due to patient leaving prior to being seen by health care provider: Secondary | ICD-10-CM | POA: Insufficient documentation

## 2019-08-05 DIAGNOSIS — R1032 Left lower quadrant pain: Secondary | ICD-10-CM | POA: Insufficient documentation

## 2019-08-05 DIAGNOSIS — R1031 Right lower quadrant pain: Secondary | ICD-10-CM | POA: Insufficient documentation

## 2019-08-05 LAB — URINALYSIS, ROUTINE W REFLEX MICROSCOPIC
Bilirubin Urine: NEGATIVE
Glucose, UA: 50 mg/dL — AB
Ketones, ur: NEGATIVE mg/dL
Leukocytes,Ua: NEGATIVE
Nitrite: NEGATIVE
Protein, ur: NEGATIVE mg/dL
Specific Gravity, Urine: 1.018 (ref 1.005–1.030)
pH: 5 (ref 5.0–8.0)

## 2019-08-05 MED ORDER — SODIUM CHLORIDE 0.9% FLUSH: 3.0000 mL | Freq: Once | INTRAVENOUS | Status: DC

## 2019-08-05 NOTE — ED Triage Notes (Signed)
Pt reports lower abd pain and burning to bilateral flank area x1 week associated with pressure and decrease urine flow

## 2019-08-06 NOTE — ED Notes (Signed)
No answer for vitals recheck and after getting patient stickers from everyone in lobby and outside patient not seen

## 2019-08-13 ENCOUNTER — Emergency Department (HOSPITAL_COMMUNITY): Payer: Medicaid Other

## 2019-08-13 ENCOUNTER — Emergency Department (HOSPITAL_COMMUNITY)
Admission: EM | Admit: 2019-08-13 | Discharge: 2019-08-13 | Disposition: A | Discharge status: Home / Self Care | Payer: Medicaid Other | Attending: Emergency Medicine | Admitting: Emergency Medicine

## 2019-08-13 ENCOUNTER — Encounter (HOSPITAL_COMMUNITY): Payer: Self-pay

## 2019-08-13 DIAGNOSIS — Z90710 Acquired absence of both cervix and uterus: Secondary | ICD-10-CM | POA: Insufficient documentation

## 2019-08-13 DIAGNOSIS — Z87442 Personal history of urinary calculi: Secondary | ICD-10-CM | POA: Insufficient documentation

## 2019-08-13 DIAGNOSIS — R109 Unspecified abdominal pain: Secondary | ICD-10-CM

## 2019-08-13 DIAGNOSIS — I10 Essential (primary) hypertension: Secondary | ICD-10-CM | POA: Insufficient documentation

## 2019-08-13 DIAGNOSIS — R1030 Lower abdominal pain, unspecified: Secondary | ICD-10-CM | POA: Insufficient documentation

## 2019-08-13 DIAGNOSIS — Z87891 Personal history of nicotine dependence: Secondary | ICD-10-CM | POA: Insufficient documentation

## 2019-08-13 DIAGNOSIS — E119 Type 2 diabetes mellitus without complications: Secondary | ICD-10-CM | POA: Insufficient documentation

## 2019-08-13 LAB — URINALYSIS, ROUTINE W REFLEX MICROSCOPIC
Bilirubin Urine: NEGATIVE
Glucose, UA: NEGATIVE mg/dL
Ketones, ur: NEGATIVE mg/dL
Nitrite: NEGATIVE
Protein, ur: NEGATIVE mg/dL
Specific Gravity, Urine: 1.019 (ref 1.005–1.030)
pH: 5 (ref 5.0–8.0)

## 2019-08-13 LAB — COMPREHENSIVE METABOLIC PANEL
ALT: 18 U/L (ref 0–44)
AST: 20 U/L (ref 15–41)
Albumin: 3.1 g/dL — ABNORMAL LOW (ref 3.5–5.0)
Alkaline Phosphatase: 92 U/L (ref 38–126)
Anion gap: 8 (ref 5–15)
BUN: 14 mg/dL (ref 6–20)
CO2: 26 mmol/L (ref 22–32)
Calcium: 9.9 mg/dL (ref 8.9–10.3)
Chloride: 106 mmol/L (ref 98–111)
Creatinine, Ser: 1.08 mg/dL — ABNORMAL HIGH (ref 0.44–1.00)
GFR calc Af Amer: >60 mL/min (ref >60)
GFR calc non Af Amer: 57 mL/min — ABNORMAL LOW (ref >60)
Glucose, Bld: 158 mg/dL — ABNORMAL HIGH (ref 70–99)
Potassium: 3.8 mmol/L (ref 3.5–5.1)
Sodium: 140 mmol/L (ref 135–145)
Total Bilirubin: 0.3 mg/dL (ref 0.3–1.2)
Total Protein: 6.9 g/dL (ref 6.5–8.1)

## 2019-08-13 LAB — CBC
HCT: 42.7 % (ref 36.0–46.0)
Hemoglobin: 13.3 g/dL (ref 12.0–15.0)
MCH: 28.7 pg (ref 26.0–34.0)
MCHC: 31.1 g/dL (ref 30.0–36.0)
MCV: 92 fL (ref 80.0–100.0)
Platelets: 266 10*3/uL (ref 150–400)
RBC: 4.64 MIL/uL (ref 3.87–5.11)
RDW: 14.2 % (ref 11.5–15.5)
WBC: 5.4 10*3/uL (ref 4.0–10.5)
nRBC: 0 % (ref 0.0–0.2)

## 2019-08-13 LAB — LIPASE, BLOOD: Lipase: 20 U/L (ref 11–51)

## 2019-08-13 LAB — WET PREP, GENITAL
Clue Cells Wet Prep HPF POC: NONE SEEN
Sperm: NONE SEEN
Trich, Wet Prep: NONE SEEN
Yeast Wet Prep HPF POC: NONE SEEN

## 2019-08-13 MED ORDER — IBUPROFEN 400 MG PO TABS
400.0000 mg | ORAL_TABLET | Freq: Once | ORAL | Status: AC | PRN
  Administered 2019-08-13: 400 mg via ORAL
  Filled 2019-08-13: qty 1

## 2019-08-13 MED ORDER — OXYCODONE-ACETAMINOPHEN 5-325 MG PO TABS: 1.0000 | ORAL_TABLET | Freq: Four times a day (QID) | ORAL | 0 refills | Status: DC | PRN

## 2019-08-13 MED ORDER — SENNOSIDES-DOCUSATE SODIUM 8.6-50 MG PO TABS
1.0000 | ORAL_TABLET | Freq: Every evening | ORAL | 0 refills | Status: AC | PRN
Start: 2019-08-13 — End: ?

## 2019-08-13 MED ORDER — ONDANSETRON 4 MG PO TBDP
4.0000 mg | ORAL_TABLET | Freq: Once | ORAL | Status: AC | PRN
  Administered 2019-08-13: 4 mg via ORAL
  Filled 2019-08-13: qty 1

## 2019-08-13 NOTE — ED Triage Notes (Signed)
Pt reports right sided abd pain that radiates to her back and groin, hx of kidney stones. Denies any blood in her urine, no N/V

## 2019-08-13 NOTE — ED Provider Notes (Signed)
Emergency Department Provider Note   I have reviewed the triage vital signs and the nursing notes.   HISTORY  Chief Complaint Abdominal Pain   HPI Rachel Brock is a 59 y.o. female w/ pmh of HLD, HTN, DM, and multiple prior kidney stones presents to the emergency department for evaluation of burning pain in the bilateral flanks with some mild to moderate pain in the back.  She states that the symptoms feel similar to her prior kidney stones but is also had urinary tract infections which are similar.  Symptoms been present for the past week.  She denies any specific dysuria but does have some vaginal pressure and discomfort.  Denies any vaginal bleeding or discharge.  No concern for sexually transmitted infection.  She has not experienced fever, chills, nausea, vomiting.  She has not seen a urologist for stones in the past and has never required intervention for kidney stones.   Past Medical History:  Diagnosis Date   Diabetes mellitus without complication (HCC)    Gout    High cholesterol    Hypertension    Renal disorder    kidney stones    There are no problems to display for this patient.   Past Surgical History:  Procedure Laterality Date   ABDOMINAL HYSTERECTOMY      Allergies Patient has no known allergies.  No family history on file.  Social History Social History   Tobacco Use   Smoking status: Former Smoker    Types: Cigarettes   Smokeless tobacco: Never Used  Substance Use Topics   Alcohol use: No   Drug use: No    Review of Systems  Constitutional: No fever/chills Eyes: No visual changes. ENT: No sore throat. Cardiovascular: Denies chest pain. Respiratory: Denies shortness of breath. Gastrointestinal: No abdominal pain.  No nausea, no vomiting.  No diarrhea.  No constipation. Genitourinary: Negative for dysuria. Burning in the bilateral flanks with vaginal discomfort.  Musculoskeletal: Positive for back pain. Skin: Negative for  rash. Neurological: Negative for headaches, focal weakness or numbness.  10-point ROS otherwise negative.  ____________________________________________   PHYSICAL EXAM:  VITAL SIGNS: ED Triage Vitals  Enc Vitals Group     BP 08/13/19 0948 (!) 155/72     Pulse Rate 08/13/19 0948 82     Resp 08/13/19 0948 14     Temp 08/13/19 0948 97.9 F (36.6 C)     Temp Source 08/13/19 0948 Oral     SpO2 08/13/19 0948 93 %     Weight 08/13/19 0948 182 lb (82.6 kg)     Height 08/13/19 0948 5\' 1"  (1.549 m)   Constitutional: Alert and oriented. Well appearing and in no acute distress. Eyes: Conjunctivae are normal.  Head: Atraumatic. Nose: No congestion/rhinnorhea. Mouth/Throat: Mucous membranes are moist.   Neck: No stridor.  Cardiovascular: Normal rate, regular rhythm. Good peripheral circulation. Grossly normal heart sounds.   Respiratory: Normal respiratory effort.  No retractions. Lungs CTAB. Gastrointestinal: Soft and nontender. No distention. No CVA tenderness.  Genitourinary: Exam performed with patient consent and nurse chaperone. No external masses or prolapse. No adnexal tenderness or masses. No CMT.  Musculoskeletal: No gross deformities of extremities. Neurologic:  Normal speech and language. Skin:  Skin is warm, dry and intact. No rash noted.  ____________________________________________   LABS (all labs ordered are listed, but only abnormal results are displayed)  Labs Reviewed  WET PREP, GENITAL - Abnormal; Notable for the following components:      Result Value  WBC, Wet Prep HPF POC FEW (*)    All other components within normal limits  COMPREHENSIVE METABOLIC PANEL - Abnormal; Notable for the following components:   Glucose, Bld 158 (*)    Creatinine, Ser 1.08 (*)    Albumin 3.1 (*)    GFR calc non Af Amer 57 (*)    All other components within normal limits  URINALYSIS, ROUTINE W REFLEX MICROSCOPIC - Abnormal; Notable for the following components:   Hgb urine  dipstick SMALL (*)    Leukocytes,Ua TRACE (*)    Bacteria, UA FEW (*)    All other components within normal limits  URINE CULTURE  LIPASE, BLOOD  CBC  WET PREP  (BD AFFIRM) ([place])  GC/CHLAMYDIA PROBE AMP ([place]) NOT AT [facility]   ____________________________________________  RADIOLOGY  CT Renal Stone Study  Result Date: 08/13/2019 CLINICAL DATA:  Bilateral lower abdominal pain and flank pain, left greater than right. History of renal calculi. EXAM: CT ABDOMEN AND PELVIS WITHOUT CONTRAST TECHNIQUE: Multidetector CT imaging of the abdomen and pelvis was performed following the standard protocol without IV contrast. COMPARISON:  05/06/2019 FINDINGS: Lower chest: The lung bases are clear of acute process. No pleural effusion or pulmonary lesions. The heart is normal in size. No pericardial effusion. The distal esophagus and aorta are unremarkable. Hepatobiliary: No hepatic lesions are identified without contrast. The gallbladder is mildly contracted. Suspect small gallstones. No common bile duct dilatation. Pancreas: No mass, inflammation or ductal dilatation. Spleen: Normal size. No focal lesions. Adrenals/Urinary Tract: The adrenal glands are normal. No obstructing ureteral calculi are identified. No hydroureteronephrosis. There are tiny bilateral renal calculi noted. No worrisome renal or bladder lesions without contrast. Stomach/Bowel: The stomach, duodenum, small bowel and colon are grossly normal without oral contrast. No acute inflammatory changes, mass lesions or obstructive findings. The terminal ileum and appendix are normal. Vascular/Lymphatic: Moderate age advanced atherosclerotic calcifications involving the aorta and iliac arteries but no aneurysm. No mesenteric or retroperitoneal mass or lymphadenopathy. Reproductive: The uterus is surgically absent. Both ovaries are still present and appear normal. Other: No pelvic mass or adenopathy. No free pelvic fluid collections. No inguinal  mass or adenopathy. No abdominal wall hernia or subcutaneous lesions. Musculoskeletal: No significant bony findings. IMPRESSION: 1. Small bilateral renal calculi but no obstructing ureteral calculi or bladder calculi. 2. No acute abdominal/pelvic findings, mass lesions or adenopathy. 3. Suspect small gallstones. 4. Age advanced atherosclerotic calcifications involving the aorta and iliac arteries. 5. Aortic atherosclerosis. Aortic Atherosclerosis (ICD10-I70.0). Electronically Signed   By: Rudie Meyer M.D.   On: 08/13/2019 10:58    ____________________________________________   PROCEDURES  Procedure(s) performed:   Procedures  None  ____________________________________________   INITIAL IMPRESSION / ASSESSMENT AND PLAN / ED COURSE  Pertinent labs & imaging results that were available during my care of the patient were reviewed by me and considered in my medical decision making (see chart for details).   Patient presents emergency department with back and flank pain bilaterally with some vaginal pressure.  CT shows bilateral renal calculi but nothing obstructing.  She does have some small gallstones noted on CT which are similar to prior.  She has no focal tenderness in the right upper quadrant and lab work shows normal LFTs and bilirubin. No leukocytosis.  Patient does have a small amount of hemoglobin on her UA but no evidence of urinary tract infection.  Will send urine for culture and complete a pelvic exam here.  Question if the patient may have  passed a small stone earlier which was causing some discomfort which is now improved.  Have advised close follow-up with urology given frequent episodes of stone and will reassess after pelvic exam.   Wet prep negative. Will send urine for culture but will hold on abx for now. Will send home with Urology referral and contact info along with pain medication presuming a small, recently passed kidney stone. Given abdominal exam I do not see an  indication for vascular or f/u abdominal imaging with contrast on emergent basis.   [place] drug database reviewed prior to Percocet Rx.  ____________________________________________  FINAL CLINICAL IMPRESSION(S) / ED DIAGNOSES  Final diagnoses:  Flank pain  Lower abdominal pain     MEDICATIONS GIVEN DURING THIS VISIT:  Medications  ondansetron (ZOFRAN-ODT) disintegrating tablet 4 mg (4 mg Oral Given 08/13/19 0955)  ibuprofen (ADVIL) tablet 400 mg (400 mg Oral Given 08/13/19 0955)     NEW OUTPATIENT MEDICATIONS STARTED DURING THIS VISIT:  New Prescriptions   OXYCODONE-ACETAMINOPHEN (PERCOCET/ROXICET) 5-325 MG TABLET    Take 1 tablet by mouth every 6 (six) hours as needed for severe pain.   SENNA-DOCUSATE (SENOKOT-S) 8.6-50 MG TABLET    Take 1 tablet by mouth at bedtime as needed for mild constipation or moderate constipation.    Note:  This document was prepared using Dragon voice recognition software and may include unintentional dictation errors.  Alona Bene, MD, [facility]    Ericka Marcellus, Arlyss Repress, MD 08/13/19 Rosamaria Lints

## 2019-08-13 NOTE — Discharge Instructions (Signed)
You were seen in the emergency room today with back, flank, lower abdominal discomfort.  Your CT scan showed kidney stones in the kidneys but not in a location where it would cause severe pain.  Your pelvic exam and other labs were largely unremarkable.  I did not see evidence of a urinary tract infection that would require antibiotics but I am sending this for culture and you will be called if bacteria grow that need to be treated with antibiotics.  I have also sent a referral to a local urologist for follow-up given your frequent kidney stone presentations to the emergency department.  I have called in some stronger pain medicine to the pharmacy in case your pain becomes severe or returns.  If you do not need this medicine you should not take it only for mild or moderate pain.  You can take Tylenol and or Motrin as needed for the symptoms.  I have also called in some constipation medication if you do need to take the Percocet.  This will impair your ability to drive a car and you cannot take it with other pain medicines, sedating medicines, alcohol.  Please follow closely with your primary care doctor and return to the emergency department any new or suddenly worsening symptoms such as uncontrollable pain, fever, or confusion.

## 2019-08-14 LAB — URINE CULTURE: Culture: NO GROWTH

## 2019-08-14 LAB — GC/CHLAMYDIA PROBE AMP (~~LOC~~) NOT AT ARMC
Chlamydia: NEGATIVE
Comment: NEGATIVE
Comment: NORMAL
Neisseria Gonorrhea: NEGATIVE

## 2020-01-28 ENCOUNTER — Other Ambulatory Visit: Payer: Self-pay

## 2020-01-28 ENCOUNTER — Emergency Department (HOSPITAL_COMMUNITY)
Admission: EM | Admit: 2020-01-28 | Discharge: 2020-01-28 | Disposition: A | Discharge status: Home / Self Care | Payer: Self-pay | Attending: Emergency Medicine | Admitting: Emergency Medicine

## 2020-01-28 ENCOUNTER — Emergency Department (HOSPITAL_COMMUNITY): Payer: Self-pay

## 2020-01-28 DIAGNOSIS — Y93E5 Activity, floor mopping and cleaning: Secondary | ICD-10-CM | POA: Insufficient documentation

## 2020-01-28 DIAGNOSIS — Z87891 Personal history of nicotine dependence: Secondary | ICD-10-CM | POA: Insufficient documentation

## 2020-01-28 DIAGNOSIS — S61412A Laceration without foreign body of left hand, initial encounter: Secondary | ICD-10-CM | POA: Insufficient documentation

## 2020-01-28 DIAGNOSIS — Z7982 Long term (current) use of aspirin: Secondary | ICD-10-CM | POA: Insufficient documentation

## 2020-01-28 DIAGNOSIS — E119 Type 2 diabetes mellitus without complications: Secondary | ICD-10-CM | POA: Insufficient documentation

## 2020-01-28 DIAGNOSIS — W260XXA Contact with knife, initial encounter: Secondary | ICD-10-CM | POA: Insufficient documentation

## 2020-01-28 DIAGNOSIS — Z7984 Long term (current) use of oral hypoglycemic drugs: Secondary | ICD-10-CM | POA: Insufficient documentation

## 2020-01-28 DIAGNOSIS — I10 Essential (primary) hypertension: Secondary | ICD-10-CM | POA: Insufficient documentation

## 2020-01-28 DIAGNOSIS — Z79899 Other long term (current) drug therapy: Secondary | ICD-10-CM | POA: Insufficient documentation

## 2020-01-28 NOTE — Discharge Instructions (Signed)
Keep wound clean and dry. Do NOT use peroxide. Use Dove soap as needed. Apply Bacitracin twice daily. Recheck with your doctor for any concerns.

## 2020-01-28 NOTE — ED Triage Notes (Signed)
Cut self Sunday with a serrated knife, laceration to palm of right hand-- lac is clean/ edges well approx-- having numbness and pain in right ring and pinky finger.

## 2020-01-28 NOTE — ED Provider Notes (Signed)
[facility] EMERGENCY DEPARTMENT Provider Note   CSN: [id number] Arrival date & time: 01/28/20  1108     History Chief Complaint  Patient presents with  . Laceration    Rachel Brock is a 60 y.o. female.  Patient is a 60 year old female with a history of diabetes who presents with a cut to her right hand for two days. Patient accidentally cut hand on a serrated knife while cleaning (states she reached over to turn off the water and accidentally hit the knife with her hand). Patient complains of numbness and tingling in 4th and 5th fingers. She states that she has pain near laceration and on entire hand. She has used hydrogen peroxide to clean wound. She denies fever. She denies any pus coming from wound. Pain is worse with movement of the hand, patient is right hand dominant.         Past Medical History:  Diagnosis Date  . Diabetes mellitus without complication (HCC)   . Gout   . High cholesterol   . Hypertension   . Renal disorder    kidney stones    There are no problems to display for this patient.   Past Surgical History:  Procedure Laterality Date  . ABDOMINAL HYSTERECTOMY       OB History   No obstetric history on file.     No family history on file.  Social History   Tobacco Use  . Smoking status: Former Smoker    Types: Cigarettes  . Smokeless tobacco: Never Used  Substance Use Topics  . Alcohol use: No  . Drug use: No    Home Medications Prior to Admission medications   Medication Sig Start Date End Date Taking? Authorizing Provider  albuterol (PROVENTIL HFA;VENTOLIN HFA) 108 (90 Base) MCG/ACT inhaler Inhale 1-2 puffs into the lungs every 6 (six) hours as needed for wheezing or shortness of breath. Patient not taking: Reported on 01/27/2018 12/30/16   Rolland Porter, MD  AMLODIPINE BESYLATE PO Take 1 tablet by mouth daily.    [provider]  aspirin EC 81 MG tablet Take 81 mg by mouth daily.    [provider]   HYDROcodone-acetaminophen (NORCO/VICODIN) 5-325 MG tablet Take 1 tablet by mouth every 4 (four) hours as needed. Patient not taking: Reported on 12/30/2016 10/01/16   Elpidio Anis, PA-C  ibuprofen (ADVIL,MOTRIN) 600 MG tablet Take 1 tablet (600 mg total) by mouth every 6 (six) hours as needed. Patient not taking: Reported on 12/30/2016 04/14/14   Lurene Shadow, PA-C  indomethacin (INDOCIN) 25 MG capsule Take 1 capsule (25 mg total) by mouth 3 (three) times daily as needed. Patient not taking: Reported on 12/30/2016 10/01/16   Elpidio Anis, PA-C  LOSARTAN POTASSIUM PO Take 1 tablet by mouth daily.    [provider]  metFORMIN (GLUCOPHAGE) 500 MG tablet Take 500 mg by mouth 2 (two) times daily with a meal.    [provider]  ondansetron (ZOFRAN ODT) 4 MG disintegrating tablet Take 1 tablet (4 mg total) by mouth every 8 (eight) hours as needed for nausea or vomiting. 05/06/19   Joy, Shawn C, PA-C  ondansetron (ZOFRAN) 4 MG tablet Take 1 tablet (4 mg total) by mouth every 8 (eight) hours as needed for nausea or vomiting. Patient not taking: Reported on 12/30/2016 02/17/15   Ward, Chase Picket, PA-C  oxyCODONE-acetaminophen (PERCOCET/ROXICET) 5-325 MG tablet Take 1 tablet by mouth every 6 (six) hours as needed for severe pain.  08/13/19   Long, Wonda Olds, MD  rosuvastatin (CRESTOR) 40 MG tablet Take 40 mg by mouth every morning.    [provider]  senna-docusate (SENOKOT-S) 8.6-50 MG tablet Take 1 tablet by mouth at bedtime as needed for mild constipation or moderate constipation. 08/13/19   Long, Wonda Olds, MD  tamsulosin (FLOMAX) 0.4 MG CAPS capsule Take 1 capsule (0.4 mg total) by mouth daily. 05/06/19   Joy, Shawn C, PA-C  traMADol (ULTRAM) 50 MG tablet Take 1 tablet (50 mg total) by mouth every 6 (six) hours as needed. Patient not taking: Reported on 12/30/2016 04/14/14   Noe Gens, PA-C    Allergies    Patient has no known allergies.  Review of Systems   Review of  Systems  Constitutional: Negative for fever.  Musculoskeletal: Positive for myalgias.  Skin: Positive for wound. Negative for color change.  Allergic/Immunologic: Positive for immunocompromised state.  Neurological: Negative for weakness and numbness.    Physical Exam Updated Vital Signs BP (!) 160/110   Pulse (!) 59   Temp 98.1 F (36.7 C) (Oral)   Resp 16   Ht 5\' 1"  (1.549 m)   Wt 84.8 kg   SpO2 95%   BMI 35.33 kg/m   Physical Exam Vitals and nursing note reviewed.  Constitutional:      General: She is not in acute distress.    Appearance: She is well-developed and well-nourished. She is not diaphoretic.  HENT:     Head: Normocephalic and atraumatic.  Cardiovascular:     Rate and Rhythm: Normal rate and regular rhythm.     Pulses: Normal pulses.     Heart sounds: Normal heart sounds.  Pulmonary:     Effort: Pulmonary effort is normal.     Breath sounds: Normal breath sounds. No wheezing.  Musculoskeletal:        General: Tenderness present. No swelling.     Comments: Grip strength intact of bilateral hands. Unable to fully close right hand secondary to pain in the right hand. 102mm linear laceration to palm of right hand over mid 4th metacarpal, no surrounding erythema, swelling, drainage. No pain with palpation along flexor/extensor tendons of the right hand/digits.   Skin:    General: Skin is warm and dry.     Capillary Refill: Capillary refill takes less than 2 seconds.     Findings: No erythema or rash.     Comments: 8 mm cut on right hand without purulence or drainage.   Neurological:     Mental Status: She is alert and oriented to person, place, and time.     Sensory: No sensory deficit.     Motor: No weakness.     Comments: Intact proprioception, localization, and sensation to sharp and dull on right digits  Psychiatric:        Mood and Affect: Mood and affect normal.        Behavior: Behavior normal.     ED Results / Procedures / Treatments   Labs (all  labs ordered are listed, but only abnormal results are displayed) Labs Reviewed - No data to display  EKG None  Radiology DG Hand Complete Right  Result Date: 01/28/2020 CLINICAL DATA:  Right hand puncture wound between the 4th and 5th metacarpals 3 days ago. Numbness and tingling in the ring finger and little finger. EXAM: RIGHT HAND - COMPLETE 3+ VIEW COMPARISON:  None. FINDINGS: There is no evidence of fracture or dislocation. There is no evidence of arthropathy or  other focal bone abnormality. Soft tissues are unremarkable. IMPRESSION: Normal examination. Electronically Signed   By: Beckie Salts M.D.   On: 01/28/2020 12:43    Procedures Procedures (including critical care time)  Medications Ordered in ED Medications - No data to display  ED Course  I have reviewed the triage vital signs and the nursing notes.  Pertinent labs & imaging results that were available during my care of the patient were reviewed by me and considered in my medical decision making (see chart for details).  Clinical Course as of 01/28/20 1436  Tue Jan 28, 2020  2358 60 year old female with history of diabetes, right-hand-dominant, puncture wound to the palm of the right hand which occurred 2 days ago, axonal injury.  X-ray is negative for retained foreign body or bony injury.  On exam has a small linear laceration to the palm of the hand with localized tenderness.  Does not appear to have action developing now 2 days after the injury. Advised patient to discontinue use of peroxide, clean with mild soap and apply bacitracin twice daily.  Recommend wound check with PCP and follow-up. [LM]    Clinical Course User Index [LM] Alden Hipp   MDM Rules/Calculators/A&P                          Final Clinical Impression(s) / ED Diagnoses Final diagnoses:  Laceration of left hand, foreign body presence unspecified, initial encounter    Rx / DC Orders ED Discharge Orders    None       Jeannie Fend, PA-C 01/28/20 1436    Jacalyn Lefevre, MD 01/28/20 1501

## 2020-03-02 ENCOUNTER — Other Ambulatory Visit: Payer: Self-pay | Admitting: Obstetrics and Gynecology

## 2020-03-02 DIAGNOSIS — Z1231 Encounter for screening mammogram for malignant neoplasm of breast: Secondary | ICD-10-CM

## 2020-04-02 ENCOUNTER — Ambulatory Visit
Admission: RE | Admit: 2020-04-02 | Discharge: 2020-04-02 | Disposition: A | Discharge status: Home / Self Care | Payer: Medicaid Other | Source: Ambulatory Visit | Attending: Obstetrics and Gynecology | Admitting: Obstetrics and Gynecology

## 2020-04-02 ENCOUNTER — Encounter (INDEPENDENT_AMBULATORY_CARE_PROVIDER_SITE_OTHER): Payer: Self-pay

## 2020-04-02 ENCOUNTER — Other Ambulatory Visit: Payer: Self-pay

## 2020-04-02 ENCOUNTER — Ambulatory Visit: Payer: No Typology Code available for payment source | Admitting: *Deleted

## 2020-04-02 VITALS — BP 148/84 | Wt 188.3 lb

## 2020-04-02 DIAGNOSIS — Z1231 Encounter for screening mammogram for malignant neoplasm of breast: Secondary | ICD-10-CM

## 2020-04-02 DIAGNOSIS — Z1239 Encounter for other screening for malignant neoplasm of breast: Secondary | ICD-10-CM

## 2020-04-02 NOTE — Patient Instructions (Addendum)
Explained breast self awareness with Rachel Brock. Patient did not need a Pap smear today due to last Pap smear was in November 2021 per patient. Let patient know that [facility] will cover Pap smears every three years unless has a history of an abnormal Pap smear. Referred patient to the [facility] for a screening mammogram on the mobile unit. Appointment scheduled Thursday, April 02, 2020 at 1400. Patient escorted to the mobile unit following [facility] for her screening mammogram. Let patient know the Breast Center will follow up with her within the next couple weeks with results of her mammogram by letter or phone. Rachel Brock verbalized understanding.  Levante Simones, Kathaleen Maser, RN 12:52 PM

## 2020-04-02 NOTE — Progress Notes (Signed)
Ms. Rachel Brock is a 60 y.o. female who presents to [facility] clinic today with no complaints.    Pap Smear: Pap smear not completed today. Last Pap smear was in November 2021 at [facility] and was normal per patient. Per patient has no history of an abnormal Pap smear. Per patient has a history of a supracervical hysterectomy in 2000 due to fibroids. Last Pap smear result is not available in Epic.   Physical exam: Breasts Breasts symmetrical. No skin abnormalities bilateral breasts. No nipple retraction bilateral breasts. No nipple discharge bilateral breasts. No lymphadenopathy. No lumps palpated bilateral breasts. No complaints of pain or tenderness on exam.       Pelvic/Bimanual Pap is not indicated today per [facility] guidelines.    Smoking History: Patient is a former smoker that quit in 2000.  Patient Navigation: Patient education provided. Access to services provided for patient through [facility] program.   Colorectal Cancer Screening: Per patient had a colonoscopy completed 6 years ago. No complaints today.    Breast and Cervical Cancer Risk Assessment: Patient does not have family history of breast cancer, known genetic mutations, or radiation treatment to the chest before age 15. Patient does not have history of cervical dysplasia, immunocompromised, or DES exposure in-utero.  Risk Assessment    Risk Scores      04/02/2020   Last edited by: Narda Rutherford, LPN   5-year risk: 1.1 %   Lifetime risk: 5.8 %          A: [facility] exam without pap smear No complaints.  P: Referred patient to the [facility] for a screening mammogram on the mobile unit. Appointment scheduled Thursday, April 02, 2020 at 1400.  Priscille Heidelberg, RN 04/02/2020 12:52 PM

## 2020-04-08 ENCOUNTER — Other Ambulatory Visit: Payer: Self-pay | Admitting: Obstetrics and Gynecology

## 2020-04-08 DIAGNOSIS — R928 Other abnormal and inconclusive findings on diagnostic imaging of breast: Secondary | ICD-10-CM

## 2020-04-23 ENCOUNTER — Other Ambulatory Visit: Payer: Self-pay

## 2020-04-23 ENCOUNTER — Ambulatory Visit
Admission: RE | Admit: 2020-04-23 | Discharge: 2020-04-23 | Disposition: A | Discharge status: Home / Self Care | Payer: No Typology Code available for payment source | Source: Ambulatory Visit | Attending: Obstetrics and Gynecology | Admitting: Obstetrics and Gynecology

## 2020-04-23 ENCOUNTER — Ambulatory Visit: Admission: RE | Admit: 2020-04-23 | Payer: No Typology Code available for payment source | Source: Ambulatory Visit

## 2020-04-23 ENCOUNTER — Other Ambulatory Visit: Payer: Self-pay | Admitting: Obstetrics and Gynecology

## 2020-04-23 DIAGNOSIS — R928 Other abnormal and inconclusive findings on diagnostic imaging of breast: Secondary | ICD-10-CM

## 2021-01-29 NOTE — Progress Notes (Deleted)
Cardiology Office Note:   Date:  01/29/2021  NAME:  Rachel Brock    MRN: [id number] DOB:  [birth year]   PCP:  Augustine Radar, MD  Cardiologist:  None  Electrophysiologist:  None   Referring MD: Sherral Hammers, FNP   No chief complaint on file. ***  History of Present Illness:   Rachel Brock is a 61 y.o. female with a hx of diabetes, hypertension who is being seen today for the evaluation of murmur at the request of Augustine Radar, MD.  Problem List DM -A1c 7.3 HTN HLD -T chol 216, HDL 52, LDL 146, TG 102  Past Medical History: Past Medical History:  Diagnosis Date   Chronic kidney disease    Diabetes mellitus without complication (HCC)    Gout    High cholesterol    Hypertension    Kidney stones     Past Surgical History: Past Surgical History:  Procedure Laterality Date   ABDOMINAL HYSTERECTOMY      Current Medications: No outpatient medications have been marked as taking for the 02/01/21 encounter (Appointment) with Sande Rives, MD.     Allergies:    Patient has no known allergies.   Social History: Social History   Socioeconomic History   Marital status: Single    Spouse name: Not on file   Number of children: 3   Years of education: Not on file   Highest education level: Master's degree (e.g., MA, MS, MEng, MEd, MSW, MBA)  Occupational History   Not on file  Tobacco Use   Smoking status: Former    Types: Cigarettes   Smokeless tobacco: Never  Vaping Use   Vaping Use: Never used  Substance and Sexual Activity   Alcohol use: No   Drug use: No   Sexual activity: Not Currently  Other Topics Concern   Not on file  Social History Narrative   Not on file   Social Determinants of Health   Financial Resource Strain: Not on file  Food Insecurity: Not on file  Transportation Needs: No Transportation Needs   Lack of Transportation (Medical): No   Lack of Transportation (Non-Medical): No  Physical Activity: Not on file   Stress: Not on file  Social Connections: Not on file     Family History: The patient's ***family history includes Diabetes in her brother and mother; Heart attack in her father; Heart disease in her father; Hyperlipidemia in her mother; Hypertension in her mother; Stroke in her mother.  ROS:   All other ROS reviewed and negative. Pertinent positives noted in the HPI.     EKGs/Labs/Other Studies Reviewed:   The following studies were personally reviewed by me today:  EKG:  EKG is *** ordered today.  The ekg ordered today demonstrates ***, and was personally reviewed by me.   Recent Labs: No results found for requested labs within last 8760 hours.   Recent Lipid Panel    Component Value Date/Time   CHOL 270 (H) 10/09/2009 2144   TRIG 143 10/09/2009 2144   HDL 41 10/09/2009 2144   CHOLHDL 6.6 Ratio 10/09/2009 2144   VLDL 29 10/09/2009 2144   LDLCALC 200 (H) 10/09/2009 2144    Physical Exam:   VS:  There were no vitals taken for this visit.   Wt Readings from Last 3 Encounters:  04/02/20 188 lb 4.8 oz (85.4 kg)  01/28/20 187 lb (84.8 kg)  08/13/19 182 lb (82.6 kg)    General: Well nourished, well developed, in  no acute distress Head: Atraumatic, normal size  Eyes: PEERLA, EOMI  Neck: Supple, no JVD Endocrine: No thryomegaly Cardiac: Normal S1, S2; RRR; no murmurs, rubs, or gallops Lungs: Clear to auscultation bilaterally, no wheezing, rhonchi or rales  Abd: Soft, nontender, no hepatomegaly  Ext: No edema, pulses 2+ Musculoskeletal: No deformities, BUE and BLE strength normal and equal Skin: Warm and dry, no rashes   Neuro: Alert and oriented to person, place, time, and situation, CNII-XII grossly intact, no focal deficits  Psych: Normal mood and affect   ASSESSMENT:   Rachel Brock is a 61 y.o. female who presents for the following: No diagnosis found.  PLAN:   There are no diagnoses linked to this encounter.  {Are you ordering a CV Procedure (e.g. stress test,  cath, DCCV, TEE, etc)?   Press F2        :[id number]}  Disposition: No follow-ups on file.  Medication Adjustments/Labs and Tests Ordered: Current medicines are reviewed at length with the patient today.  Concerns regarding medicines are outlined above.  No orders of the defined types were placed in this encounter.  No orders of the defined types were placed in this encounter.   There are no Patient Instructions on file for this visit.   Time Spent with Patient: I have spent a total of *** minutes with patient reviewing hospital notes, telemetry, EKGs, labs and examining the patient as well as establishing an assessment and plan that was discussed with the patient.  > 50% of time was spent in direct patient care.  Signed, Lenna Gilford. Flora Lipps, MD, [facility]   [facility]  [address] [place], [place] [address] [phone]  01/29/2021 6:09 PM

## 2021-02-01 ENCOUNTER — Ambulatory Visit: Payer: No Typology Code available for payment source | Admitting: Cardiovascular Disease

## 2021-02-01 DIAGNOSIS — R011 Cardiac murmur, unspecified: Secondary | ICD-10-CM

## 2021-05-18 ENCOUNTER — Emergency Department (HOSPITAL_BASED_OUTPATIENT_CLINIC_OR_DEPARTMENT_OTHER): Payer: Self-pay

## 2021-05-18 ENCOUNTER — Emergency Department (HOSPITAL_BASED_OUTPATIENT_CLINIC_OR_DEPARTMENT_OTHER)
Admission: EM | Admit: 2021-05-18 | Discharge: 2021-05-18 | Disposition: A | Discharge status: Home / Self Care | Payer: Self-pay | Attending: Emergency Medicine | Admitting: Emergency Medicine

## 2021-05-18 ENCOUNTER — Other Ambulatory Visit: Payer: Self-pay

## 2021-05-18 ENCOUNTER — Encounter (HOSPITAL_BASED_OUTPATIENT_CLINIC_OR_DEPARTMENT_OTHER): Payer: Self-pay

## 2021-05-18 DIAGNOSIS — Z20822 Contact with and (suspected) exposure to covid-19: Secondary | ICD-10-CM | POA: Insufficient documentation

## 2021-05-18 DIAGNOSIS — I10 Essential (primary) hypertension: Secondary | ICD-10-CM | POA: Insufficient documentation

## 2021-05-18 DIAGNOSIS — R070 Pain in throat: Secondary | ICD-10-CM | POA: Insufficient documentation

## 2021-05-18 DIAGNOSIS — Z79899 Other long term (current) drug therapy: Secondary | ICD-10-CM | POA: Insufficient documentation

## 2021-05-18 DIAGNOSIS — R519 Headache, unspecified: Secondary | ICD-10-CM | POA: Insufficient documentation

## 2021-05-18 DIAGNOSIS — E119 Type 2 diabetes mellitus without complications: Secondary | ICD-10-CM | POA: Insufficient documentation

## 2021-05-18 DIAGNOSIS — Z7982 Long term (current) use of aspirin: Secondary | ICD-10-CM | POA: Insufficient documentation

## 2021-05-18 DIAGNOSIS — H9201 Otalgia, right ear: Secondary | ICD-10-CM | POA: Insufficient documentation

## 2021-05-18 DIAGNOSIS — Z7984 Long term (current) use of oral hypoglycemic drugs: Secondary | ICD-10-CM | POA: Insufficient documentation

## 2021-05-18 LAB — RESP PANEL BY RT-PCR (FLU A&B, COVID) ARPGX2
Influenza A by PCR: NEGATIVE
Influenza B by PCR: NEGATIVE
SARS Coronavirus 2 by RT PCR: NEGATIVE

## 2021-05-18 LAB — GROUP A STREP BY PCR: Group A Strep by PCR: NOT DETECTED

## 2021-05-18 MED ORDER — DEXAMETHASONE 4 MG PO TABS
10.0000 mg | ORAL_TABLET | Freq: Once | ORAL | Status: AC
  Administered 2021-05-18: 10 mg via ORAL
  Filled 2021-05-18: qty 3

## 2021-05-18 MED ORDER — TRAMADOL HCL 50 MG PO TABS: 50.0000 mg | ORAL_TABLET | Freq: Four times a day (QID) | ORAL | 0 refills | Status: AC | PRN

## 2021-05-18 MED ORDER — TRAMADOL HCL 50 MG PO TABS
50.0000 mg | ORAL_TABLET | Freq: Once | ORAL | Status: AC
  Administered 2021-05-18: 50 mg via ORAL
  Filled 2021-05-18: qty 1

## 2021-05-18 NOTE — ED Triage Notes (Signed)
Pt. State she is having ear pain, sore throat, eye pain, and headache. Pt states s/s started Sunday. Denies any congestion or drainage. States pain is 8/10. States she feels warm, denies fever. States she has been taking tylenol and advil for symptoms.  ?

## 2021-05-18 NOTE — ED Provider Notes (Signed)
?[facility] EMER[facility]ENCY DEPT ?Provider Note ? ? ?CSN: [id number] ?Arrival date & time: 05/18/21  8786 ? ?  ? ?History ? ?Chief Complaint  ?Patient presents with  ? Headache  ? ? ?Rachel Brock is a 61 y.o. female. ? ?Patient here with headache, ear pain, throat pain.  History of hypertension, diabetes, high cholesterol.  Patient states that her blood pressure has been high recently.  She is supposed to get a new blood pressure medication this week in the mail.  She did not take her blood pressure medications this morning prior to coming here.  She has been having some ear discomfort on the right and some throat discomfort.  Having a weird sensation over the right side of her head.  Not having any active eye pain or blurred vision or stroke symptoms including weakness or numbness.  No chest pain or shortness of breath.  Tylenol and ibuprofen have not ? ?The history is provided by the patient.  ? ?  ? ?Home Medications ?Prior to Admission medications   ?Medication Sig Start Date End Date Taking? Authorizing Provider  ?traMADol (ULTRAM) 50 MG tablet Take 1 tablet (50 mg total) by mouth every 6 (six) hours as needed. 05/18/21  Yes Virgina Norfolk, DO  ?AMLODIPINE BESYLATE PO Take 1 tablet by mouth daily.    [provider]  ?aspirin EC 81 MG tablet Take 81 mg by mouth daily.    [provider]  ?LOSARTAN POTASSIUM PO Take 1 tablet by mouth daily.    [provider]  ?metFORMIN (GLUCOPHAGE) 500 MG tablet Take 500 mg by mouth 2 (two) times daily with a meal.    [provider]  ?ondansetron (ZOFRAN ODT) 4 MG disintegrating tablet Take 1 tablet (4 mg total) by mouth every 8 (eight) hours as needed for nausea or vomiting. 05/06/19   Joy, Shawn C, PA-C  ?rosuvastatin (CRESTOR) 40 MG tablet Take 40 mg by mouth every morning.    [provider]  ?senna-docusate (SENOKOT-S) 8.6-50 MG tablet Take 1 tablet by mouth at bedtime as needed for mild constipation or moderate  constipation. 08/13/19   Long, Arlyss Repress, MD  ?tamsulosin (FLOMAX) 0.4 MG CAPS capsule Take 1 capsule (0.4 mg total) by mouth daily. 05/06/19   Joy, Hillard Danker, PA-C  ?   ? ?Allergies    ?Patient has no known allergies.   ? ?Review of Systems   ?Review of Systems ? ?Physical Exam ?Updated Vital Signs ?BP (!) 153/68   Pulse 61   Temp 97.9 ?F (36.6 ?C)   Resp 16   Ht 5\' 1"  (1.549 m)   Wt 81.6 kg   SpO2 100%   BMI 34.01 kg/m?  ?Physical Exam ?Vitals and nursing note reviewed.  ?Constitutional:   ?   General: She is not in acute distress. ?   Appearance: She is well-developed. She is not ill-appearing.  ?HENT:  ?   Head: Normocephalic and atraumatic.  ?   Mouth/Throat:  ?   Mouth: Mucous membranes are moist.  ?Eyes:  ?   General: No visual field deficit. ?   Extraocular Movements: Extraocular movements intact.  ?   Right eye: No nystagmus.  ?   Left eye: No nystagmus.  ?   Conjunctiva/sclera: Conjunctivae normal.  ?   Pupils: Pupils are equal, round, and reactive to light.  ?Cardiovascular:  ?   Rate and Rhythm: Normal rate and regular rhythm.  ?   Heart sounds: Normal heart sounds. No murmur  heard. ?Pulmonary:  ?   Effort: Pulmonary effort is normal. No respiratory distress.  ?   Breath sounds: Normal breath sounds.  ?Abdominal:  ?   Palpations: Abdomen is soft.  ?   Tenderness: There is no abdominal tenderness.  ?Musculoskeletal:     ?   General: No swelling.  ?   Cervical back: Normal range of motion and neck supple.  ?Skin: ?   General: Skin is warm and dry.  ?   Capillary Refill: Capillary refill takes less than 2 seconds.  ?Neurological:  ?   Mental Status: She is alert and oriented to person, place, and time.  ?   Cranial Nerves: No cranial nerve deficit, dysarthria or facial asymmetry.  ?   Sensory: No sensory deficit.  ?   Motor: No weakness.  ?   Coordination: Coordination normal.  ?   Comments: 5+ out of 5 strength throughout, normal sensation, normal gait, normal speech pupils are equal and reactive   ?Psychiatric:     ?   Mood and Affect: Mood normal.  ? ? ?ED Results / Procedures / Treatments   ?Labs ?(all labs ordered are listed, but only abnormal results are displayed) ?Labs Reviewed  ?GROUP A STREP BY PCR  ?RESP PANEL BY RT-PCR (FLU A&B, COVID) [id number]  ? ? ?EKG ?None ? ?Radiology ?CT Head Wo Contrast ? ?Result Date: 05/18/2021 ?CLINICAL DATA:  Headache EXAM: CT HEAD WITHOUT CONTRAST TECHNIQUE: Contiguous axial images were obtained from the base of the skull through the vertex without intravenous contrast. RADIATION DOSE REDUCTION: This exam was performed according to the departmental dose-optimization program which includes automated exposure control, adjustment of the mA and/or kV according to patient size and/or use of iterative reconstruction technique. COMPARISON:  None. FINDINGS: Brain: No acute intracranial hemorrhage, mass effect, or herniation. No extra-axial fluid collections. No evidence of acute territorial infarct. No hydrocephalus. Vascular: No hyperdense vessel or unexpected calcification. Skull: Normal. Negative for fracture or focal lesion. Sinuses/Orbits: No acute finding. Other: None. IMPRESSION: No acute intracranial process identified. Electronically Signed   By: Jannifer Hick M.D.   On: 05/18/2021 11:01   ? ?Procedures ?Procedures  ? ? ?Medications Ordered in ED ?Medications  ?traMADol (ULTRAM) tablet 50 mg (50 mg Oral Given 05/18/21 1101)  ?dexamethasone (DECADRON) tablet 10 mg (10 mg Oral Given 05/18/21 1101)  ? ? ?ED Course/ Medical Decision Making/ A&P ?  ?                        ?Medical Decision Making ?Amount and/or Complexity of Data Reviewed ?Radiology: ordered. ? ?Risk ?Prescription drug management. ? ? ?Rachel Brock is here with headache, ear pain, throat pain for the last several days.  Your mildly elevated upon arrival but on repeat 170/90.  She has not taken her blood pressure meds this morning.  Patient concerned that her blood pressure is high and having some head  discomfort.  Also having some ear discomfort and throat discomfort.  Denies any weakness, vision changes, numbness, chest pain, shortness of breath.  Overall she appears very well.  Viral sounding symptoms the last several days.  Ear and throat exam is unremarkable.  Will swab for strep and COVID.  We will get a head CT to further evaluate given high blood pressure and headache.  I have no concern for stroke or ACS.  We will give her dose of Decadron and tramadol I suspect that this is either a migraine or  allergy related event.  They have tried over-the-counter medication without much help.  She is due for new blood pressure medication this week and overall given that she is not taking her blood pressure medications today and that her blood pressure is only mildly elevated we will not make any adjustments to those at this time.  We will treat headache with steroids and tramadol and have her follow-up with her primary care doctor. ? ?Head CT per my review and interpretation shows no acute findings.  Discharged in good condition.  Recommend follow-up with primary care doctor.  Understands return precautions. ? ?This chart was dictated using voice recognition software.  Despite best efforts to proofread,  errors can occur which can change the documentation meaning.  ? ? ? ? ? ? ? ?Final Clinical Impression(s) / ED Diagnoses ?Final diagnoses:  ?Nonintractable headache, unspecified chronicity pattern, unspecified headache type  ? ? ?Rx / DC Orders ?ED Discharge Orders   ? ?      Ordered  ?  traMADol (ULTRAM) 50 MG tablet  Every 6 hours PRN       ? 05/18/21 1052  ? ?  ?  ? ?  ? ? ?  ?Virgina NorfolkCuratolo, Keeya Dyckman, DO ?05/18/21 1135 ? ?

## 2021-05-18 NOTE — Discharge Instructions (Addendum)
Head CT shows no acute findings.  There is no head bleed or evidence of acute process.  Continue management with 800 mg ibuprofen every 8 hours as needed for pain.  Recommend 1000 mg every 6 hours as needed for pain.  Take tramadol for breakthrough pain.  Do not drive or do any dangerous activities while taking this medication as it is sedating.  Continue to take your blood pressure medications as prescribed.  Recommend charting her blood pressure twice daily and follow-up with her primary care doctor.   ?

## 2021-05-19 ENCOUNTER — Other Ambulatory Visit: Payer: Self-pay | Admitting: Urology

## 2021-05-19 DIAGNOSIS — R109 Unspecified abdominal pain: Secondary | ICD-10-CM

## 2021-05-20 ENCOUNTER — Ambulatory Visit (INDEPENDENT_AMBULATORY_CARE_PROVIDER_SITE_OTHER): Payer: No Typology Code available for payment source

## 2021-05-20 ENCOUNTER — Encounter: Payer: Self-pay | Admitting: Internal Medicine

## 2021-05-20 ENCOUNTER — Ambulatory Visit (INDEPENDENT_AMBULATORY_CARE_PROVIDER_SITE_OTHER): Payer: No Typology Code available for payment source | Admitting: Internal Medicine

## 2021-05-20 VITALS — BP 99/63 | HR 60 | Ht 61.0 in | Wt 182.8 lb

## 2021-05-20 DIAGNOSIS — R002 Palpitations: Secondary | ICD-10-CM

## 2021-05-20 DIAGNOSIS — R011 Cardiac murmur, unspecified: Secondary | ICD-10-CM

## 2021-05-20 DIAGNOSIS — R0600 Dyspnea, unspecified: Secondary | ICD-10-CM

## 2021-05-20 NOTE — Patient Instructions (Signed)
Medication Instructions:  ?Your physician recommends that you continue on your current medications as directed. Please refer to the Current Medication list given to you today. ? ?*If you need a refill on your cardiac medications before your next appointment, please call your pharmacy* ? ? ?Testing/Procedures: ? ?Echo & Stress Test done at [facility] - [address] - [address]rd Floor ?Dr. Wyline Mood has requested that you have an echocardiogram. Echocardiography is a painless test that uses sound waves to create images of your heart. It provides your doctor with information about the size and shape of your heart and how well your heart?s chambers and valves are working. This procedure takes approximately one hour. There are no restrictions for this procedure. ? ?Dr. Wyline Mood has ordered a Lexiscan Myocardial Perfusion Imaging Study. ? ?Please arrive 15 minutes prior to your appointment time for registration and insurance purposes. ?  ?The test will take approximately 3 to 4 hours to complete; you may bring reading material.  If someone comes with you to your appointment, they will need to remain in the main lobby due to limited space in the testing area. **If you are pregnant or breastfeeding, please notify the nuclear lab prior to your appointment** ?  ?How to prepare for your Myocardial Perfusion Test: ?Do not eat or drink 3 hours prior to your test, except you may have water. ?Do not consume products containing caffeine (regular or decaffeinated) 12 hours prior to your test. (ex: coffee, chocolate, sodas, tea). ?Do wear comfortable clothes (no dresses or overalls) and walking shoes, tennis shoes preferred (No heels or open toe shoes are allowed). ?Do NOT wear cologne, perfume, aftershave, or lotions (deodorant is allowed). ?If you use an inhaler, use it the AM of your test and bring it with you.  ?If you use a nebulizer, use it the AM of your test.  ?If these instructions are not followed, your test will have to be  rescheduled. ? ?ZIO XT- Long Term Monitor Instructions ? ?Your physician has requested you wear a ZIO patch monitor for 7 days.  ?This is a single patch monitor. [employer] supplies one patch monitor per enrollment. Additional ?stickers are not available. Please do not apply patch if you will be having a Nuclear Stress Test,  ?Echocardiogram, Cardiac CT, MRI, or Chest Xray during the period you would be wearing the  ?monitor. The patch cannot be worn during these tests. You cannot remove and re-apply the  ?ZIO XT patch monitor.  ?Your ZIO patch monitor will be mailed 3 day USPS to your address on file. It may take 3-5 days  ?to receive your monitor after you have been enrolled.  ?Once you have received your monitor, please review the enclosed instructions. Your monitor  ?has already been registered assigning a specific monitor serial # to you. ? ?Billing and Patient Assistance Program Information ? ?We have supplied [employer] with any of your insurance information on file for billing purposes. ?[employer] offers a sliding scale Patient Assistance Program for patients that do not have  ?insurance, or whose insurance does not completely cover the cost of the ZIO monitor.  ?You must apply for the Patient Assistance Program to qualify for this discounted rate.  ?To apply, please call [employer] at [phone], select option 4, select option 2, ask to apply for  ?Patient Assistance Program. Meredeth Ide will ask your household income, and how many people  ?are in your household. They will quote your out-of-pocket cost based on that information.  ?Meredeth Ide will also  be able to set up a 36-month, interest-free payment plan if needed. ? ?Applying the monitor ?  ?Shave hair from upper left chest.  ?Hold abrader disc by orange tab. Rub abrader in 40 strokes over the upper left chest as  ?indicated in your monitor instructions.  ?Clean area with 4 enclosed alcohol pads. Let dry.  ?Apply patch as indicated in monitor instructions. Patch will  be placed under collarbone on left  ?side of chest with arrow pointing upward.  ?Rub patch adhesive wings for 2 minutes. Remove white label marked "1". Remove the white  ?label marked "2". Rub patch adhesive wings for 2 additional minutes.  ?While looking in a mirror, press and release button in center of patch. A small green light will  ?flash 3-4 times. This will be your only indicator that the monitor has been turned on.  ?Do not shower for the first 24 hours. You may shower after the first 24 hours.  ?Press the button if you feel a symptom. You will hear a small click. Record Date, Time and  ?Symptom in the Patient Logbook.  ?When you are ready to remove the patch, follow instructions on the last 2 pages of Patient  ?Logbook. Stick patch monitor onto the last page of Patient Logbook.  ?Place Patient Logbook in the blue and white box. Use locking tab on box and tape box closed  ?securely. The blue and white box has prepaid postage on it. Please place it in the mailbox as  ?soon as possible. Your physician should have your test results approximately 7 days after the  ?monitor has been mailed back to [facility].  ?Call [facility] at [phone] if you have questions regarding  ?your ZIO XT patch monitor. Call them immediately if you see an orange light blinking on your  ?monitor.  ?If your monitor falls off in less than 4 days, contact our Monitor department at [phone].  ?If your monitor becomes loose or falls off after 4 days call [employer] at [phone] for  ?suggestions on securing your monitor ? ? ?Follow-Up: ?At [facility], you and your health needs are our priority.  As part of our continuing mission to provide you with exceptional heart care, we have created designated Provider Care Teams.  These Care Teams include your primary Cardiologist (physician) and Advanced Practice Providers (APPs -  Physician Assistants and Nurse Practitioners) who all work together to provide you  with the care you need, when you need it. ? ?We recommend signing up for the patient portal called "MyChart".  Sign up information is provided on this After Visit Summary.  MyChart is used to connect with patients for Virtual Visits (Telemedicine).  Patients are able to view lab/test results, encounter notes, upcoming appointments, etc.  Non-urgent messages can be sent to your provider as well.   ?To learn more about what you can do with MyChart, go to [email or web address].   ? ?Your next appointment:   ?3 month(s) ? ?The format for your next appointment:   ?In Person ? ?Provider:   ?Dr. Carolan Clines  ?

## 2021-05-20 NOTE — Progress Notes (Unsigned)
Enrolled patient for a 7 day Zio XT monitor to be mailed to patients home.  

## 2021-05-20 NOTE — Addendum Note (Signed)
Addended byPhineas Inches on: 05/20/2021 04:07 PM ? ? Modules accepted: Orders ? ?

## 2021-05-20 NOTE — Progress Notes (Signed)
?Cardiology Office Note:   ? ?Date:  05/20/2021  ? ?ID:  Rachel Brock, DOB July 29, 1960, MRN [id number] ? ?PCP:  Sherral Hammers, FNP ?  ?[facility] Providers ?Cardiologist:  None    ? ?Referring MD: Sherral Hammers, FNP  ? ?No chief complaint on file. ?Murmur ? ?History of Present Illness:   ? ?Rachel Brock is a 61 y.o. female with a hx of CKD, DM2, HTN no referral  for murmur and abnormal EKG ? ?She was told she had a heart murmur. She has some SOB and irregular heart rates. With walking she can get SOB; she notes this has been going on for 3 months. She notes when she is with her grandchildren and active she gets SOB and palpitations.. No chest pressure. She notes  dizziness.Father had CABG. Mother had a stroke. Brother died from lung disease. ? ?Past Medical History:  ?Diagnosis Date  ? Chronic kidney disease   ? Diabetes mellitus without complication (HCC)   ? Gout   ? High cholesterol   ? Hypertension   ? Kidney stones   ? ? ?Past Surgical History:  ?Procedure Laterality Date  ? ABDOMINAL HYSTERECTOMY    ? ? ?Current Medications: ?No outpatient medications have been marked as taking for the 05/20/21 encounter (Appointment) with Maisie Fus, MD.  ?  ? ?Allergies:   Patient has no known allergies.  ? ?Social History  ? ?Socioeconomic History  ? Marital status: Single  ?  Spouse name: Not on file  ? Number of children: 3  ? Years of education: Not on file  ? Highest education level: Master's degree (e.g., MA, MS, MEng, MEd, MSW, MBA)  ?Occupational History  ? Not on file  ?Tobacco Use  ? Smoking status: Former  ?  Types: Cigarettes  ? Smokeless tobacco: Never  ?Vaping Use  ? Vaping Use: Never used  ?Substance and Sexual Activity  ? Alcohol use: No  ? Drug use: No  ? Sexual activity: Not Currently  ?Other Topics Concern  ? Not on file  ?Social History Narrative  ? Not on file  ? ?Social Determinants of Health  ? ?Financial Resource Strain: Not on file  ?Food Insecurity: Not on file  ?Transportation  Needs: Not on file  ?Physical Activity: Not on file  ?Stress: Not on file  ?Social Connections: Not on file  ?  ? ?Family History: ?The patient's family history includes Diabetes in her brother and mother; Heart attack in her father; Heart disease in her father; Hyperlipidemia in her mother; Hypertension in her mother; Stroke in her mother. ? ?ROS:   ?Please see the history of present illness.    ? All other systems reviewed and are negative. ? ?EKGs/Labs/Other Studies Reviewed:   ? ?The following studies were reviewed today: ? ? ?EKG:  EKG is  ordered today.  The ekg ordered today demonstrates  ? ?EKG- 05/20/2021-Sinus rhythm, poor R wave progression ? ?Recent Labs: ?No results found for requested labs within last 8760 hours.  ?Recent Lipid Panel ?   ?Component Value Date/Time  ? CHOL 270 (H) 10/09/2009 2144  ? TRIG 143 10/09/2009 2144  ? HDL 41 10/09/2009 2144  ? CHOLHDL 6.6 Ratio 10/09/2009 2144  ? VLDL 29 10/09/2009 2144  ? LDLCALC 200 (H) 10/09/2009 2144  ? ? ? ?Risk Assessment/Calculations:   ?  ? ?    ? ?Physical Exam:   ? ?VS:   ? ?Vitals:  ? 05/20/21 1444  ?BP: 99/63  ?  Pulse: 60  ?SpO2: 98%  ? ? ? ?Wt Readings from Last 3 Encounters:  ?05/18/21 180 lb (81.6 kg)  ?04/02/20 188 lb 4.8 oz (85.4 kg)  ?01/28/20 187 lb (84.8 kg)  ?  ? ?GEN:  Well nourished, well developed in no acute distress ?HEENT: Normal ?NECK: No JVD; No carotid bruits ?LYMPHATICS: No lymphadenopathy ?CARDIAC: SEM worse with valsalva, no murmurs, rubs, gallops ?RESPIRATORY:  Clear to auscultation without rales, wheezing or rhonchi  ?ABDOMEN: Soft, non-tender, non-distended ?MUSCULOSKELETAL:  No edema; No deformity  ?SKIN: Warm and dry ?NEUROLOGIC:  Alert and oriented x 3 ?PSYCHIATRIC:  Normal affect  ? ?ASSESSMENT:   ? ? ?Systolic Murmur/Dizziness: AS v HOCM. Will obtain an echo.  ? ?SOB: notes worsening and occurs with activity; will plan for a lexiscan  ? ?Palpitations: will assess her rhythm with a ziopatch ? ? ? ?PLAN:   ? ?In order of  problems listed above: ? ?7 day zio patch ?TTE ?Lexiscan Stress ?Follow up in 3 months ? ?   ? ?Shared Decision Making/Informed Consent ?The risks [chest pain, shortness of breath, cardiac arrhythmias, dizziness, blood pressure fluctuations, myocardial infarction, stroke/transient ischemic attack, nausea, vomiting, allergic reaction, radiation exposure, metallic taste sensation and life-threatening complications (estimated to be 1 in 10,000)], benefits (risk stratification, diagnosing coronary artery disease, treatment guidance) and alternatives of a nuclear stress test were discussed in detail with Rachel Brock and she agrees to proceed.  ? ? ?Medication Adjustments/Labs and Tests Ordered: ?Current medicines are reviewed at length with the patient today.  Concerns regarding medicines are outlined above.  ?No orders of the defined types were placed in this encounter. ? ?No orders of the defined types were placed in this encounter. ? ? ?There are no Patient Instructions on file for this visit.  ? ?Signed, ?Maisie Fus, MD  ?05/20/2021 9:31 AM    ?[facility] ?

## 2021-05-24 ENCOUNTER — Telehealth: Payer: Self-pay | Admitting: Licensed Clinical Social Worker

## 2021-05-24 NOTE — Telephone Encounter (Signed)
LCSW noted pt Orange Card about to expire, also noted that pt has no CAFA on file and past due bills. LCSW called pt today, was able to reach her at [phone]. Pt shares that she has taken some pain medication and is a bit too sleepy to participate in full assessment but confirms she has not applied for [employer] . She was agreeable to confirming her home address and me mailing applications. I mailed her CAFA and new [employer] started. I included my card, she was encouraged to call me if she receives app, otherwise I will f/u in about a week and a half to answer questions/assist as needed.   ? ?Westley Hummer, MSW, LCSW ?Clinical Social Worker II ?[place] [facility]  ?[phone]- work cell phone (preferred) ?[phone]- desk phone ? ?

## 2021-05-25 ENCOUNTER — Telehealth (HOSPITAL_COMMUNITY): Payer: Self-pay

## 2021-05-25 ENCOUNTER — Ambulatory Visit
Admission: RE | Admit: 2021-05-25 | Discharge: 2021-05-25 | Disposition: A | Discharge status: Home / Self Care | Payer: No Typology Code available for payment source | Source: Ambulatory Visit | Attending: Urology | Admitting: Urology

## 2021-05-25 DIAGNOSIS — R109 Unspecified abdominal pain: Secondary | ICD-10-CM

## 2021-05-25 NOTE — Telephone Encounter (Signed)
Spoke with the patient, detailed instructions given. She stated that she would be here for here test. Asked to call back with any questions. S.Terrence Pizana EMTP 

## 2021-05-27 ENCOUNTER — Encounter (HOSPITAL_COMMUNITY): Payer: No Typology Code available for payment source

## 2021-05-27 IMAGING — MG MM DIGITAL DIAGNOSTIC UNILAT*R* W/ TOMO W/ CAD
6 series · 6 of 18 positions shown · non-contrast
Comparison: Previous exam(s).

CLINICAL DATA: 59-year-old female presenting as a recall from
screening for possible right breast asymmetry.

EXAM:
DIGITAL DIAGNOSTIC UNILATERAL RIGHT MAMMOGRAM WITH TOMOSYNTHESIS AND
CAD
TECHNIQUE: Right digital diagnostic mammography and breast tomosynthesis was
performed. The images were evaluated with computer-aided detection.

[R ML synth-2D]
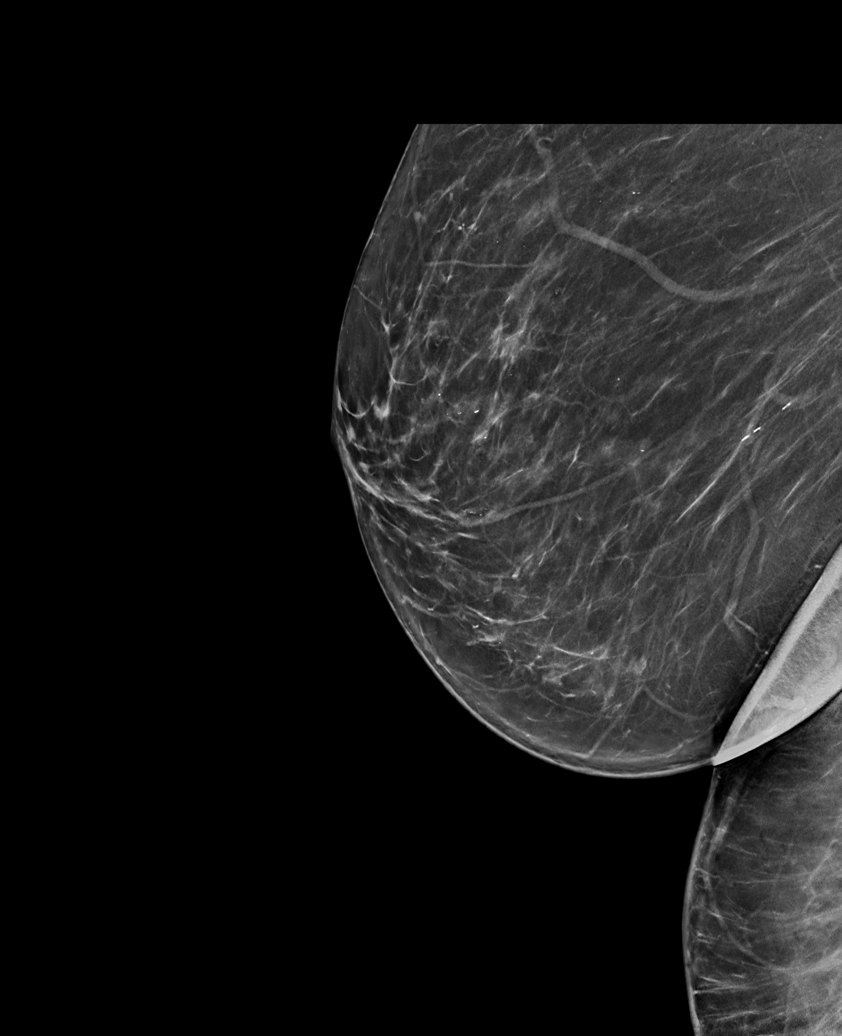

[R MLO synth-2D (1 of 2)]
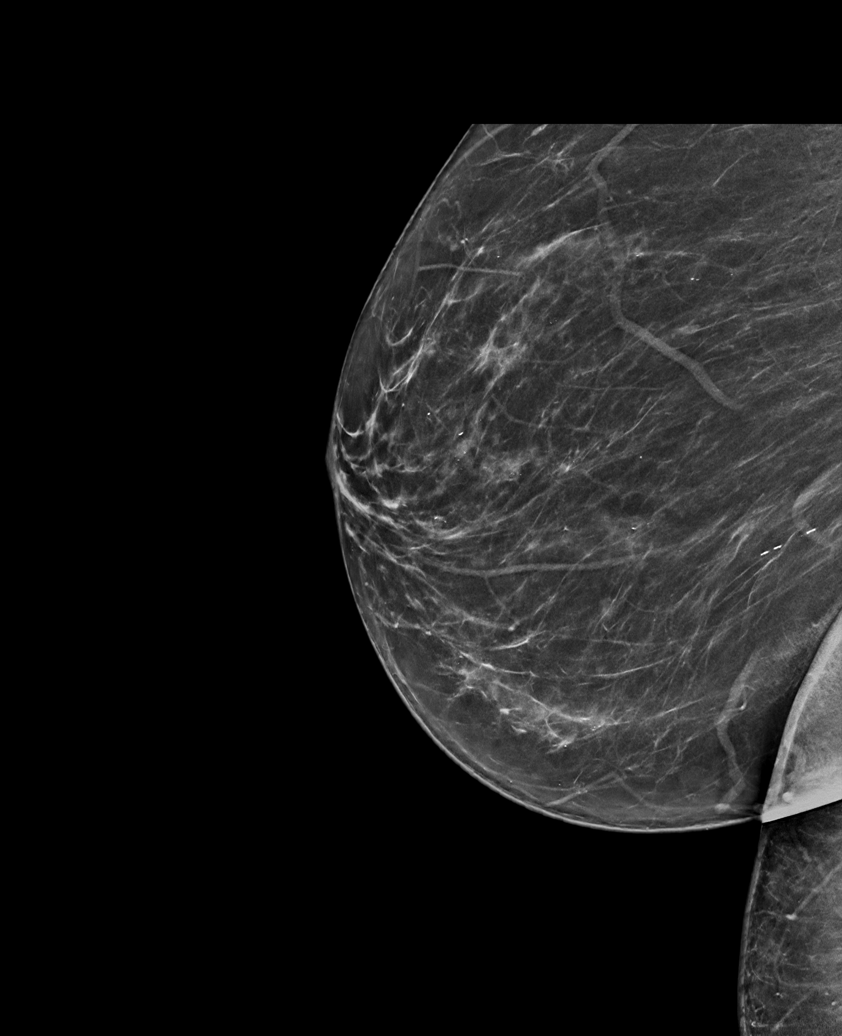

[R MLO synth-2D (2 of 2)]
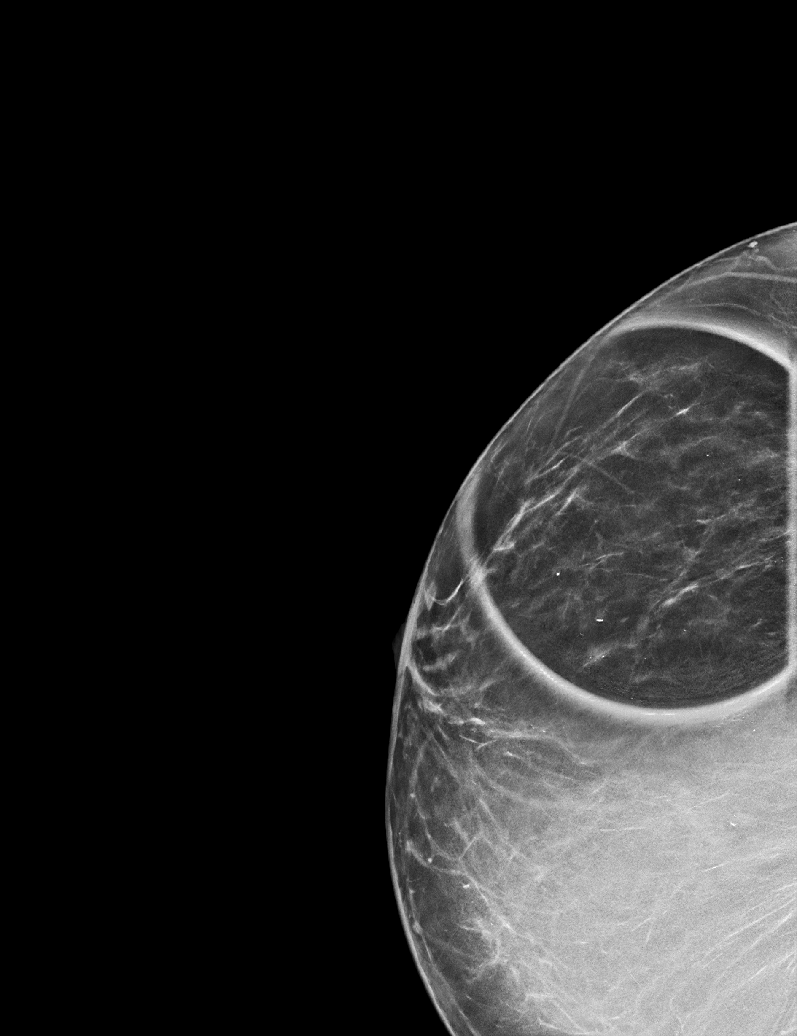

[R ML tomo · tomo slice 41/81.0]
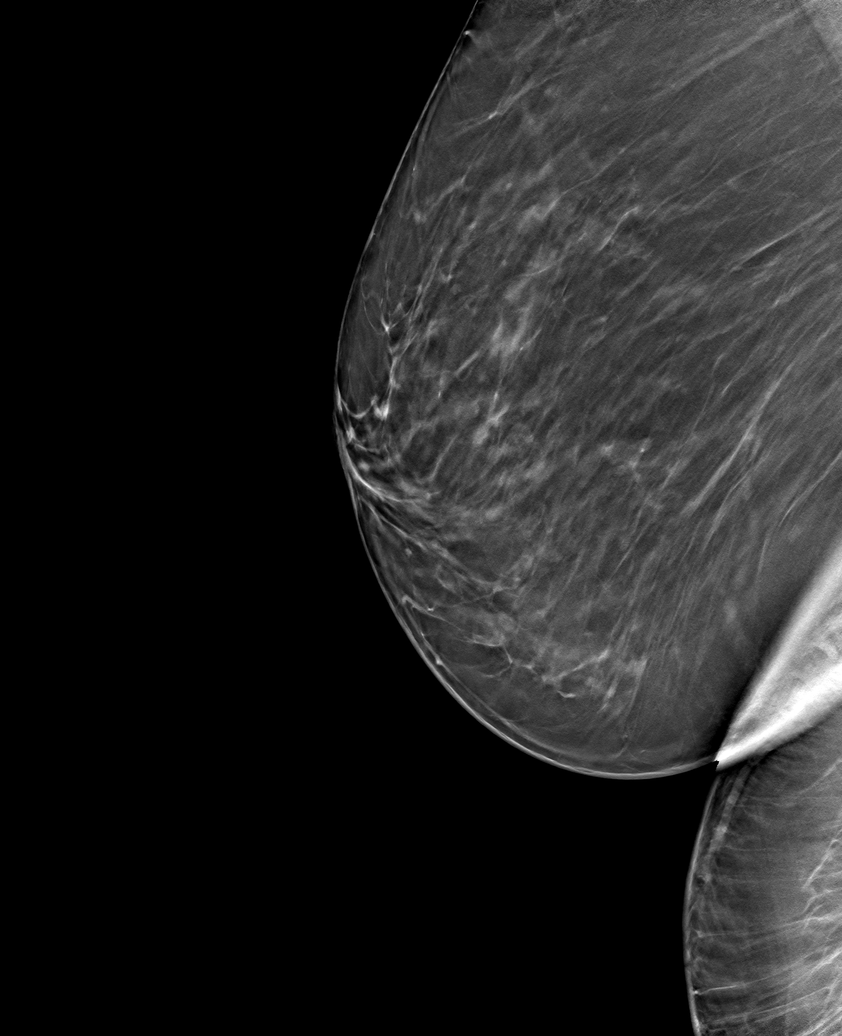

[R MLO tomo (1 of 2) · tomo slice 30/59.0]
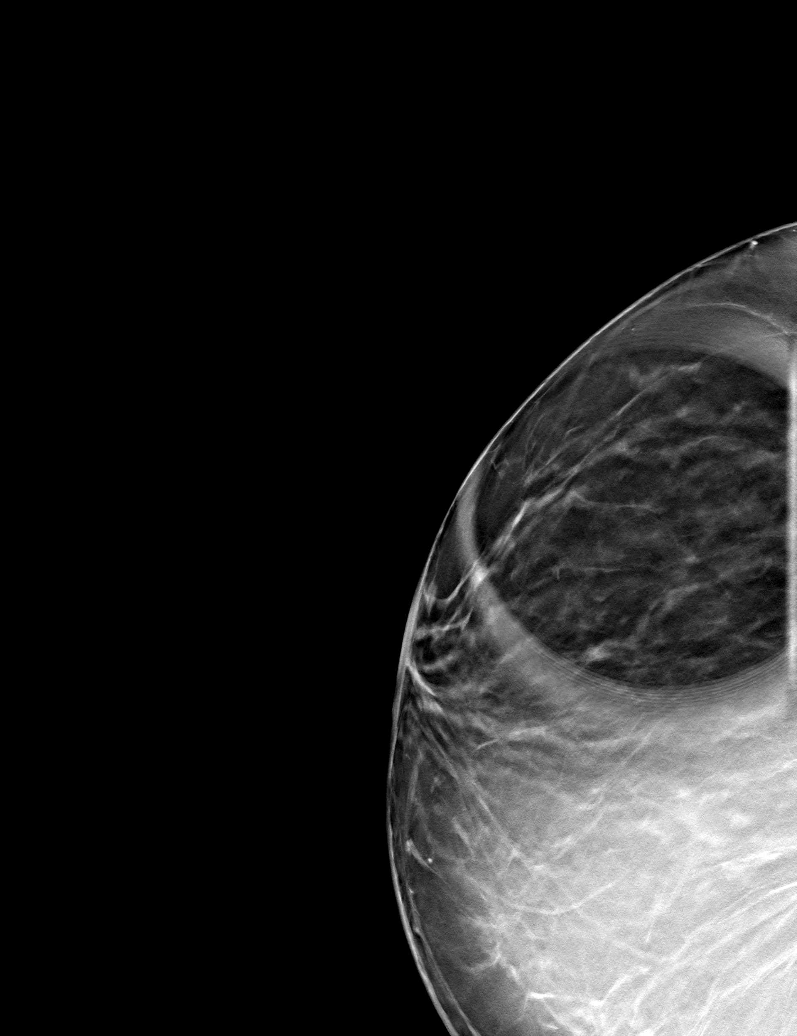

[R MLO tomo (2 of 2) · tomo slice 39/76.0]
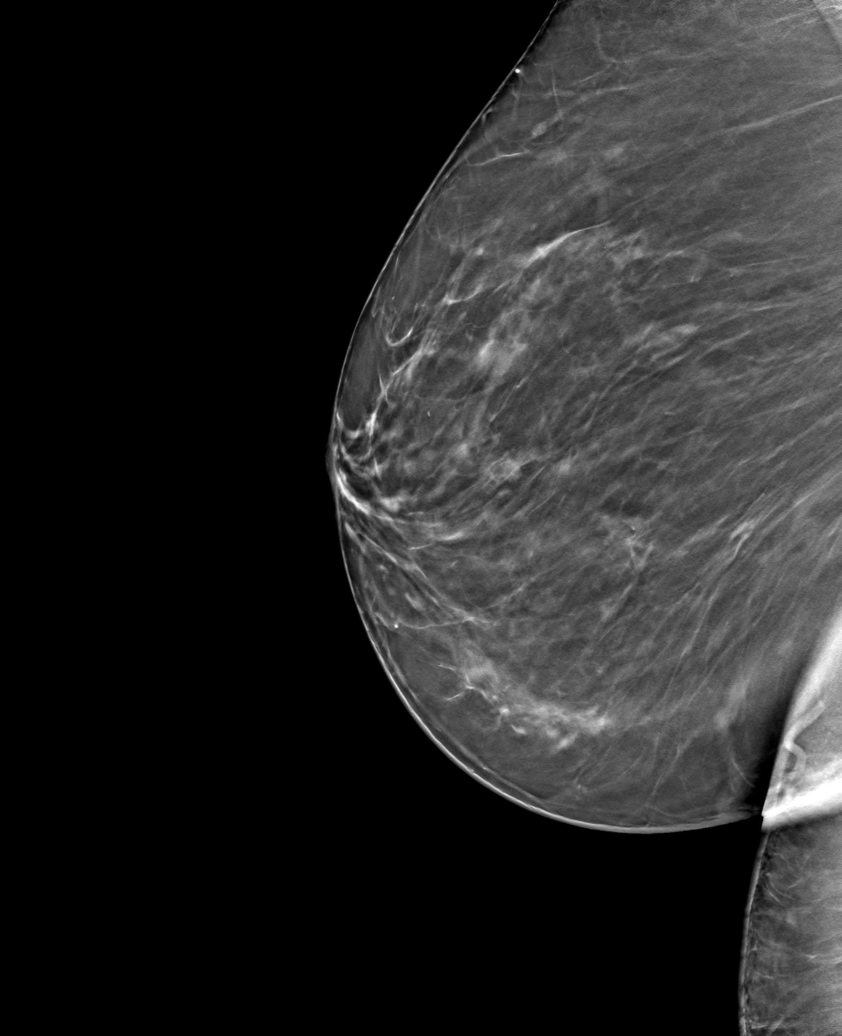

[6 of 18 positions shown; findings below may reference images not displayed]

ACR Breast Density Category b: There are scattered areas of
fibroglandular density.
FINDINGS: Full field MLO and mL tomosynthesis views as well as spot MLO
tomosynthesis views of the right breast were performed for a
questioned asymmetry seen only on the MLO view in the superior right
breast. On the additional imaging the tissue in this area disperses
without persistent asymmetry, mass or distortion. The finding on
screening mammogram most likely represented normal overlapping
fibroglandular tissue. There are no additional new findings in the
right breast on today's imaging.
IMPRESSION: No mammographic evidence of malignancy in the right breast.

RECOMMENDATION:
Screening mammogram in one year.(Code:GI-0-SGI)

I have discussed the findings and recommendations with the patient.
If applicable, a reminder letter will be sent to the patient
regarding the next appointment.

BI-RADS CATEGORY  1: Negative.

## 2021-06-01 ENCOUNTER — Ambulatory Visit (HOSPITAL_COMMUNITY): Payer: No Typology Code available for payment source

## 2021-06-01 ENCOUNTER — Ambulatory Visit (HOSPITAL_COMMUNITY): Payer: No Typology Code available for payment source | Attending: Cardiology

## 2021-06-01 DIAGNOSIS — R002 Palpitations: Secondary | ICD-10-CM | POA: Insufficient documentation

## 2021-06-01 DIAGNOSIS — R0600 Dyspnea, unspecified: Secondary | ICD-10-CM | POA: Insufficient documentation

## 2021-06-01 LAB — MYOCARDIAL PERFUSION IMAGING
LV dias vol: 51 mL (ref 46–106)
LV sys vol: 6 mL
Nuc Stress EF: 87 %
Peak HR: 100 {beats}/min
Rest HR: 64 {beats}/min
Rest Nuclear Isotope Dose: 10.7 mCi
SDS: 0
SRS: 4
SSS: 4
ST Depression (mm): 0 mm
Stress Nuclear Isotope Dose: 32.7 mCi
TID: 0.85

## 2021-06-01 MED ORDER — TECHNETIUM TC 99M TETROFOSMIN IV KIT
10.7000 | PACK | Freq: Once | INTRAVENOUS | Status: AC | PRN
  Administered 2021-06-01: 10.7 via INTRAVENOUS
  Filled 2021-06-01: qty 11

## 2021-06-01 MED ORDER — REGADENOSON 0.4 MG/5ML IV SOLN
0.4000 mg | Freq: Once | INTRAVENOUS | Status: AC
  Administered 2021-06-01: 0.4 mg via INTRAVENOUS

## 2021-06-01 MED ORDER — TECHNETIUM TC 99M TETROFOSMIN IV KIT
32.7000 | PACK | Freq: Once | INTRAVENOUS | Status: AC | PRN
  Administered 2021-06-01: 32.7 via INTRAVENOUS
  Filled 2021-06-01: qty 33

## 2021-06-03 ENCOUNTER — Telehealth: Payer: Self-pay | Admitting: Licensed Clinical Social Worker

## 2021-06-03 NOTE — Telephone Encounter (Signed)
LCSW f/u with pt, able to reach her at [phone]. She is unsure if she has received assistance applications. She cites mail as sometimes going in the wrong box at her complex. She will continue to monitor and is agreeable to me following up with her next week and remailing if needed. ? ?Westley Hummer, MSW, LCSW ?Clinical Social Worker II ?[place] [facility]  ?[phone]- work cell phone (preferred) ?[phone]- desk phone ? ?

## 2021-06-14 ENCOUNTER — Ambulatory Visit (HOSPITAL_COMMUNITY): Payer: Self-pay | Attending: Cardiovascular Disease

## 2021-06-14 DIAGNOSIS — R002 Palpitations: Secondary | ICD-10-CM | POA: Insufficient documentation

## 2021-06-14 DIAGNOSIS — R011 Cardiac murmur, unspecified: Secondary | ICD-10-CM | POA: Insufficient documentation

## 2021-06-14 DIAGNOSIS — R0600 Dyspnea, unspecified: Secondary | ICD-10-CM | POA: Insufficient documentation

## 2021-06-15 LAB — ECHOCARDIOGRAM COMPLETE
AR max vel: 0.92 cm2
AV Area VTI: 1.05 cm2
AV Area mean vel: 1 cm2
AV Mean grad: 12.7 mmHg
AV Peak grad: 26.1 mmHg
Ao pk vel: 2.56 m/s
Area-P 1/2: 3.33 cm2
S' Lateral: 1.8 cm

## 2021-06-17 DIAGNOSIS — R002 Palpitations: Secondary | ICD-10-CM

## 2021-07-01 ENCOUNTER — Telehealth: Payer: Self-pay | Admitting: Internal Medicine

## 2021-07-01 DIAGNOSIS — Z79899 Other long term (current) drug therapy: Secondary | ICD-10-CM

## 2021-07-01 MED ORDER — METOPROLOL TARTRATE 25 MG PO TABS: 12.5000 mg | ORAL_TABLET | Freq: Two times a day (BID) | ORAL | 3 refills | Status: DC

## 2021-07-01 NOTE — Telephone Encounter (Signed)
Rachel Fus, MD  06/30/2021  1:28 PM EDT     Cleone Slim Eileen Stanford, please let Mrs Borkowski know that her monitor showed some fast heart rates from the top chambers of the heart. Supraventricular tachycardia. This can be managed with a low dose BB. Please start metoprolol 12.5 mg tartrate BID. She can also start a low dose diuretic pill based on her echo. Please start lasix 20 mg daily. Please order BMET and BNP. I will see her soon in follow up   Spoke with patient regarding the following results. Patient made aware and patient verbalized understanding.   Medications Metoprolol Tartrate 12.5mg  BID sent to patient preferred pharmacy, patient states she is taking low dose diuretic HCTZ 12.5mg , I advised patient I would check with Dr. Wyline Mood to see if she still wants to add Lasix 20mg .   Patient reports her BP still intermittently runs high- up to 170 systolic. Advised patient to monitor her BP.   Order placed for BMP/BNP   Advised patient to keep appointment in July with Dr. August.  Advised patient to call back to office with any issues, questions, or concerns. Patient verbalized understanding.

## 2021-07-01 NOTE — Telephone Encounter (Signed)
Patient was calling to receive her results 

## 2021-07-05 MED ORDER — FUROSEMIDE 20 MG PO TABS: 20.0000 mg | ORAL_TABLET | Freq: Every day | ORAL | 3 refills | Status: DC

## 2021-07-05 NOTE — Telephone Encounter (Signed)
Maisie Fus, MD  You 3 days ago   MB Yes please, thank you!    You  Maisie Fus, MD 4 days ago    Patient is taking HCTZ 12.5mg - would you still like me to add lasix 20mg ? I sent in metop and ordered labs! Thank you!!    Returned call to patient and made aware of Dr. recommendations. Prescription for Lasix 20mg  once daily sent to patient preferred pharmacy. Advised patient to call back to office with any issues, questions, or concerns. Patient verbalized understanding.

## 2021-07-14 ENCOUNTER — Telehealth: Payer: Self-pay | Admitting: Licensed Clinical Social Worker

## 2021-07-14 NOTE — Telephone Encounter (Signed)
H&V Care Navigation CSW Progress Note  Clinical Social Worker contacted patient by phone to f/u on assistance applications sent. Was able to reach pt this afternoon, she requests I call her back about 3:30pm. I will reattempt pt then.   Patient is participating in a Managed Medicaid Plan:  No, self-pay, medicaid family planning only  SDOH Screenings   Alcohol Screen: Not on file  Depression (SWN4-6): Not on file  Financial Resource Strain: Not on file  Food Insecurity: Not on file  Housing: Not on file  Physical Activity: Not on file  Social Connections: Not on file  Stress: Not on file  Tobacco Use: Medium Risk (05/20/2021)   Patient History    Smoking Tobacco Use: Former    Smokeless Tobacco Use: Never    Passive Exposure: Not on file  Transportation Needs: No Transportation Needs (04/02/2020)   PRAPARE - Transportation    Lack of Transportation (Medical): No    Lack of Transportation (Non-Medical): No    Octavio Graves, MSW, LCSW Clinical Social Worker II [facility] Navigation  [phone]- work cell phone (preferred) [phone]- desk phone

## 2021-07-14 NOTE — Telephone Encounter (Signed)
Attempted pt again at 3:30pm as requested, no answer, voicemail left requesting call back.   Rachel Brock, MSW, LCSW Clinical Social Worker II [facility] Navigation  [phone]- work cell phone (preferred) [phone]- desk phone

## 2021-07-20 ENCOUNTER — Telehealth: Payer: Self-pay | Admitting: Licensed Clinical Social Worker

## 2021-08-03 ENCOUNTER — Other Ambulatory Visit: Payer: Self-pay | Admitting: Gastroenterology

## 2021-08-03 ENCOUNTER — Other Ambulatory Visit (HOSPITAL_COMMUNITY): Payer: Self-pay | Admitting: Gastroenterology

## 2021-08-03 DIAGNOSIS — R1084 Generalized abdominal pain: Secondary | ICD-10-CM

## 2021-08-09 ENCOUNTER — Other Ambulatory Visit: Payer: Self-pay | Admitting: Nurse Practitioner

## 2021-08-09 DIAGNOSIS — Z1231 Encounter for screening mammogram for malignant neoplasm of breast: Secondary | ICD-10-CM

## 2021-08-10 ENCOUNTER — Encounter (HOSPITAL_COMMUNITY)
Admission: RE | Admit: 2021-08-10 | Discharge: 2021-08-10 | Disposition: A | Discharge status: Home / Self Care | Payer: Commercial Managed Care - HMO | Source: Ambulatory Visit | Attending: Gastroenterology | Admitting: Gastroenterology

## 2021-08-10 DIAGNOSIS — R1084 Generalized abdominal pain: Secondary | ICD-10-CM | POA: Diagnosis present

## 2021-08-10 MED ORDER — TECHNETIUM TC 99M MEBROFENIN IV KIT
5.1000 | PACK | Freq: Once | INTRAVENOUS | Status: AC | PRN
  Administered 2021-08-10: 5.1 via INTRAVENOUS

## 2021-08-17 ENCOUNTER — Ambulatory Visit
Admission: RE | Admit: 2021-08-17 | Discharge: 2021-08-17 | Disposition: A | Discharge status: Home / Self Care | Payer: Commercial Managed Care - HMO | Source: Ambulatory Visit | Attending: Nurse Practitioner | Admitting: Nurse Practitioner

## 2021-08-17 DIAGNOSIS — Z1231 Encounter for screening mammogram for malignant neoplasm of breast: Secondary | ICD-10-CM

## 2021-08-19 ENCOUNTER — Ambulatory Visit: Payer: No Typology Code available for payment source | Admitting: Internal Medicine

## 2021-09-02 ENCOUNTER — Ambulatory Visit (INDEPENDENT_AMBULATORY_CARE_PROVIDER_SITE_OTHER): Payer: Commercial Managed Care - HMO | Admitting: Internal Medicine

## 2021-09-02 ENCOUNTER — Encounter: Payer: Self-pay | Admitting: Internal Medicine

## 2021-09-02 VITALS — BP 134/82 | HR 67 | Ht 61.0 in | Wt 177.2 lb

## 2021-09-02 DIAGNOSIS — I421 Obstructive hypertrophic cardiomyopathy: Secondary | ICD-10-CM | POA: Diagnosis not present

## 2021-09-02 MED ORDER — METOPROLOL TARTRATE 25 MG PO TABS: 25.0000 mg | ORAL_TABLET | Freq: Two times a day (BID) | ORAL | 3 refills | Status: DC

## 2021-09-02 NOTE — Progress Notes (Signed)
Cardiology Office Note:    Date:  09/02/2021   ID:  [name], DOB 1960-04-14, MRN [id number]  PCP:  Rachel Hammers, FNP   [facility] Providers Cardiologist:  None     Referring MD: Rachel Hammers, FNP   No chief complaint on file. Murmur  History of Present Illness:    Rachel Brock is a 61 y.o. female with a hx of CKD, DM2, HTN no referral  for murmur and abnormal EKG  She was told she had a heart murmur. She has some SOB and irregular heart rates. With walking she can get SOB; she notes this has been going on for 3 months. She notes when she is with her grandchildren and active she gets SOB and palpitations.. No chest pressure. She notes  dizziness.Father had CABG. Mother had a stroke. Brother died from lung disease.   Interim Hx 09/02/2021 She underwent TTE, showed high filling pressures. LVH. Started lasix 20 mg daily. Did zio for palpitations had some SVT, started low dose metop. Her lexiscan was normal. She feels well. No persistent DOE. She at times still has palpitations.  The 10-year ASCVD risk score (Arnett DK, et al., 2019) is: 25.5%   Values used to calculate the score:     Age: 8 years     Sex: Female     Is Non-Hispanic African American: Yes     Diabetic: Yes     Tobacco smoker: No     Systolic Blood Pressure: 134 mmHg     Is BP treated: Yes     HDL Cholesterol: 47 mg/dL     Total Cholesterol: 313 mg/dL   Past Medical History:  Diagnosis Date   Chronic kidney disease    Diabetes mellitus without complication (HCC)    Gout    High cholesterol    Hypertension    Kidney stones     Past Surgical History:  Procedure Laterality Date   ABDOMINAL HYSTERECTOMY      Current Medications: Current Meds  Medication Sig   amLODipine (NORVASC) 10 MG tablet Take 10 mg by mouth daily.   aspirin EC 81 MG tablet Take 81 mg by mouth daily.   furosemide (LASIX) 20 MG tablet Take 1 tablet (20 mg total) by mouth daily.   hydrochlorothiazide  (HYDRODIURIL) 25 MG tablet Take 12.5 mg by mouth daily.   losartan (COZAAR) 100 MG tablet Take 100 mg by mouth daily.   metFORMIN (GLUCOPHAGE) 500 MG tablet Take 500 mg by mouth 2 (two) times daily with a meal.   ondansetron (ZOFRAN ODT) 4 MG disintegrating tablet Take 1 tablet (4 mg total) by mouth every 8 (eight) hours as needed for nausea or vomiting.   oxybutynin (DITROPAN-XL) 10 MG 24 hr tablet Take 10 mg by mouth daily.   senna-docusate (SENOKOT-S) 8.6-50 MG tablet Take 1 tablet by mouth at bedtime as needed for mild constipation or moderate constipation.   tamsulosin (FLOMAX) 0.4 MG CAPS capsule Take 1 capsule (0.4 mg total) by mouth daily.   traMADol (ULTRAM) 50 MG tablet Take 1 tablet (50 mg total) by mouth every 6 (six) hours as needed.   [DISCONTINUED] metoprolol tartrate (LOPRESSOR) 25 MG tablet Take 0.5 tablets (12.5 mg total) by mouth 2 (two) times daily.     Allergies:   Patient has no known allergies.   Social History   Socioeconomic History   Marital status: Single    Spouse name: Not on file   Number of children: 3  Years of education: Not on file   Highest education level: Master's degree (e.g., MA, MS, MEng, MEd, MSW, MBA)  Occupational History   Not on file  Tobacco Use   Smoking status: Former    Types: Cigarettes   Smokeless tobacco: Never  Vaping Use   Vaping Use: Never used  Substance and Sexual Activity   Alcohol use: No   Drug use: No   Sexual activity: Not Currently  Other Topics Concern   Not on file  Social History Narrative   Not on file   Social Determinants of Health   Financial Resource Strain: Not on file  Food Insecurity: Not on file  Transportation Needs: No Transportation Needs (04/02/2020)   PRAPARE - Administrator, Civil Service (Medical): No    Lack of Transportation (Non-Medical): No  Physical Activity: Not on file  Stress: Not on file  Social Connections: Not on file     Family History: The patient's family  history includes Diabetes in her brother and mother; Heart attack in her father; Heart disease in her father; Hyperlipidemia in her mother; Hypertension in her mother; Stroke in her mother.  ROS:   Please see the history of present illness.     All other systems reviewed and are negative.  EKGs/Labs/Other Studies Reviewed:    The following studies were reviewed today:   EKG:  EKG is  ordered today.  The ekg ordered today demonstrates   EKG- 05/20/2021-Sinus rhythm, poor R wave progression  Recent Labs: No results found for requested labs within last 365 days.  Recent Lipid Panel    Component Value Date/Time   CHOL 270 (H) 10/09/2009 2144   TRIG 143 10/09/2009 2144   HDL 41 10/09/2009 2144   CHOLHDL 6.6 Ratio 10/09/2009 2144   VLDL 29 10/09/2009 2144   LDLCALC 200 (H) 10/09/2009 2144     Risk Assessment/Calculations:           Physical Exam:    VS:    Vitals:   09/02/21 1347  BP: 134/82  Pulse: 67  SpO2: 99%     Wt Readings from Last 3 Encounters:  09/02/21 177 lb 3.2 oz (80.4 kg)  06/01/21 182 lb (82.6 kg)  05/20/21 182 lb 12.8 oz (82.9 kg)     GEN:  Well nourished, well developed in no acute distress HEENT: Normal NECK: No JVD; No carotid bruits LYMPHATICS: No lymphadenopathy CARDIAC: SEM worse with valsalva, no murmurs, rubs, gallops RESPIRATORY:  Clear to auscultation without rales, wheezing or rhonchi  ABDOMEN: Soft, non-tender, non-distended MUSCULOSKELETAL:  No edema; No deformity  SKIN: Warm and dry NEUROLOGIC:  Alert and oriented x 3 PSYCHIATRIC:  Normal affect   ASSESSMENT:    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 Septal thickness of 14 mm. She has had no syncopal events. No family hx of SCD. No VT. No recommendation for ICD at this time. She has high filling pressures E/e' 17 , will continue lasix. BP are well controlled. We  discussed if having syncopal events to let us know. Also recommended an echo for her children. -continue metoprolol 25 mg tartrate BID -continue lasix 20 mg daily [discussed if feeling LH with dehydration, to hold her lasix]  SVT: seen on her ziopatch. Continue BB  HLD: continue lipitor 20 mg daily  HTN: good control. Increasing metop per above. Continue norvasc 10 mg daily   PLAN:  In order of problems listed above:  Metop 25 mg BID Follow up 6 months       Medication Adjustments/Labs and Tests Ordered: Current medicines are reviewed at length with the patient today.  Concerns regarding medicines are outlined above.  No orders of the defined types were placed in this encounter.  Meds ordered this encounter  Medications   metoprolol tartrate (LOPRESSOR) 25 MG tablet    Sig: Take 1 tablet (25 mg total) by mouth 2 (two) times daily.    Dispense:  180 tablet    Refill:  3    Patient Instructions  Medication Instructions:   -Increase metoprolol tartrate (lopressor) to 25mg  twice daily.  *If you need a refill on your cardiac medications before your next appointment, please call your pharmacy*   Follow-Up: At [facility], you and your health needs are our priority.  As part of our continuing mission to provide you with exceptional heart care, we have created designated Provider Care Teams.  These Care Teams include your primary Cardiologist (physician) and Advanced Practice Providers (APPs -  Physician Assistants and Nurse Practitioners) who all work together to provide you with the care you need, when you need it.  We recommend signing up for the patient portal called "MyChart".  Sign up information is provided on this After Visit Summary.  MyChart is used to connect with patients for Virtual Visits (Telemedicine).  Patients are able to view lab/test results, encounter notes, upcoming appointments, etc.  Non-urgent messages can be sent to your provider as well.   To learn  more about what you can do with MyChart, go to [facility].    Your next appointment:   6 month(s)  The format for your next appointment:   In Person  Provider:   [email or web address], MD    Other Instructions Recommend an echocardiogram for your children. You have a diagnosis suspicious for hypertrophic obstructive cardiomyopathy    Hypertrophic Cardiomyopathy  Hypertrophic cardiomyopathy (HCM) is a heart condition in which part of the heart muscle gets too thick. The condition can cause a dangerous and abnormal heart rhythm. It can also weaken the heart over time. The heart is divided into four chambers. The thickening usually affects the pumping chamber on the lower left (left ventricle). HCM may also affect the valve that lets blood flow into the left ventricle (mitral valve). Symptoms often begin when a person is about 59 years old. What are the causes? This condition is usually caused by abnormal genes that control heart muscle growth. These abnormal genes are passed down through families (are inherited). What increases the risk? You are more likely to develop this condition if you have a family history of HCM. What are the signs or symptoms? Symptoms of this condition include: Shortness of breath, especially after exercising or lying down. Chest pain. Dizziness. Feeling tired (fatigue). Irregular or fast heart rate. Fainting, especially after physical activity. Swelling in the legs, ankles, feet, abdomen, or neck veins. How is this diagnosed? This condition is diagnosed based on: A physical exam that involves checking for an abnormal heart sound (heart murmur). Tests, such as: An electrocardiogram (ECG). This is a test that records your heart's electrical activity. An echocardiogram. This test uses ultrasound to show whether your left ventricle is enlarged and whether it fills slowly. An exercise stress test. This test is done to collect information about how your  heart functions during exercise. A transesophageal echocardiogram. A thin tube is placed down your esophagus to allow  detailed ultrasound pictures to be taken of your heart. A Doppler test. This is a test that shows irregular blood flow and pressure differences inside the heart. You may also have other tests, including: Genetic testing done on a blood sample. A chest X-ray to see whether your heart is enlarged. An MRI. Holter monitor. This test is done to check for abnormal heart rhythms. How is this treated? Treatment for HCM depends on how severe your symptoms are. There are several options for treatment, including: Medicine. Medicines can be given to: Reduce the workload of your heart. Lower your blood pressure. Thin your blood and prevent clots. A device. Devices that can be used to treat this condition include: A pacemaker. This device helps to control your heartbeat. A defibrillator. This device restores a normal heart rhythm. Surgery. This may include a procedure to: Inject alcohol into the small blood vessels that supply your heart muscle (alcohol septal ablation).This is done during a type of procedure called cardiac catheterization. The goal is to cause the muscle to become thinner. Remove part of the wall that divides the right and left sides of the heart (septum) with a procedure called septal myectomy. Replace the mitral valve. Replace your heart with a healthy heart (heart transplant). Follow these instructions at home: Activity Avoid exercise and activities that take a lot of effort, such as shoveling snow. Do not participate in high-intensity activities, such as competitive sports. Get regular physical activity. Most people with this condition can participate in exercise at low to moderate intensity levels, such as walking. Ask your health care provider what activity level is safe for you. Lifestyle Maintain a healthy weight. If you need help with losing weight, ask your  health care provider. Eat a heart-healthy diet. Do not drink alcohol if: Your health care provider tells you not to drink. You are pregnant, may be pregnant, or are planning to become pregnant. If you drink alcohol: Limit how much you have to: 0-1 drink a day for women. 0-2 drinks a day for men. Know how much alcohol is in your drink. In the [place] one drink equals one 12 oz bottle of beer (355 mL), one 5 oz glass of wine (148 mL), or one 1 oz glass of hard liquor (44 mL). Do not use any products that contain nicotine or tobacco. These products include cigarettes, chewing tobacco, and vaping devices, such as e-cigarettes. If you need help quitting, ask your health care provider. General instructions Make sure the members of your household know how to do CPR in case of an emergency. Take over-the-counter and prescription medicines only as told by your health care provider. Keep all follow-up visits. This is important. Contact a health care provider if: You have new symptoms. Your symptoms get worse. Get help right away if: You have chest pain or shortness of breath, especially during or after exercise. You feel faint or you pass out. You have trouble breathing even at rest. Your feet or ankles swell. Your heartbeat seems irregular or seems faster than normal (palpitations). These symptoms may represent a serious problem that is an emergency. Do not wait to see if the symptoms will go away. Get medical help right away. Call your local emergency services (911 in the [place] Do not drive yourself to the hospital. Summary Hypertrophic cardiomyopathy (HCM) is a heart condition in which part of the heart muscle gets too thick. The condition can cause a dangerous and abnormal heart rhythm, and it can weaken the heart over time.  Avoid exercise and activities that take a lot of effort, such as shoveling snow. Make sure the members of your household know how to do CPR in case of an emergency. Keep  all follow-up visits. This is important. This information is not intended to replace advice given to you by your health care provider. Make sure you discuss any questions you have with your health care provider. Document Revised: 07/06/2020 Document Reviewed: 07/06/2020 Elsevier Patient Education  [address]2023 [employer]    Signed, Maisie FusBranch, Zenab Gronewold E, MD  09/02/2021 2:33 PM    [facility]

## 2021-09-02 NOTE — Patient Instructions (Addendum)
Medication Instructions:   -Increase metoprolol tartrate (lopressor) to 25mg  twice daily.  *If you need a refill on your cardiac medications before your next appointment, please call your pharmacy*   Follow-Up: At [facility], you and your health needs are our priority.  As part of our continuing mission to provide you with exceptional heart care, we have created designated Provider Care Teams.  These Care Teams include your primary Cardiologist (physician) and Advanced Practice Providers (APPs -  Physician Assistants and Nurse Practitioners) who all work together to provide you with the care you need, when you need it.  We recommend signing up for the patient portal called "MyChart".  Sign up information is provided on this After Visit Summary.  MyChart is used to connect with patients for Virtual Visits (Telemedicine).  Patients are able to view lab/test results, encounter notes, upcoming appointments, etc.  Non-urgent messages can be sent to your provider as well.   To learn more about what you can do with MyChart, go to [facility].    Your next appointment:   6 month(s)  The format for your next appointment:   In Person  Provider:   [email or web address], MD    Other Instructions Recommend an echocardiogram for your children. You have a diagnosis suspicious for hypertrophic obstructive cardiomyopathy    Hypertrophic Cardiomyopathy  Hypertrophic cardiomyopathy (HCM) is a heart condition in which part of the heart muscle gets too thick. The condition can cause a dangerous and abnormal heart rhythm. It can also weaken the heart over time. The heart is divided into four chambers. The thickening usually affects the pumping chamber on the lower left (left ventricle). HCM may also affect the valve that lets blood flow into the left ventricle (mitral valve). Symptoms often begin when a person is about 59 years old. What are the causes? This condition is usually caused by abnormal  genes that control heart muscle growth. These abnormal genes are passed down through families (are inherited). What increases the risk? You are more likely to develop this condition if you have a family history of HCM. What are the signs or symptoms? Symptoms of this condition include: Shortness of breath, especially after exercising or lying down. Chest pain. Dizziness. Feeling tired (fatigue). Irregular or fast heart rate. Fainting, especially after physical activity. Swelling in the legs, ankles, feet, abdomen, or neck veins. How is this diagnosed? This condition is diagnosed based on: A physical exam that involves checking for an abnormal heart sound (heart murmur). Tests, such as: An electrocardiogram (ECG). This is a test that records your heart's electrical activity. An echocardiogram. This test uses ultrasound to show whether your left ventricle is enlarged and whether it fills slowly. An exercise stress test. This test is done to collect information about how your heart functions during exercise. A transesophageal echocardiogram. A thin tube is placed down your esophagus to allow detailed ultrasound pictures to be taken of your heart. A Doppler test. This is a test that shows irregular blood flow and pressure differences inside the heart. You may also have other tests, including: Genetic testing done on a blood sample. A chest X-ray to see whether your heart is enlarged. An MRI. Holter monitor. This test is done to check for abnormal heart rhythms. How is this treated? Treatment for HCM depends on how severe your symptoms are. There are several options for treatment, including: Medicine. Medicines can be given to: Reduce the workload of your heart. Lower your blood pressure. Thin your blood and  prevent clots. A device. Devices that can be used to treat this condition include: A pacemaker. This device helps to control your heartbeat. A defibrillator. This device restores a  normal heart rhythm. Surgery. This may include a procedure to: Inject alcohol into the small blood vessels that supply your heart muscle (alcohol septal ablation).This is done during a type of procedure called cardiac catheterization. The goal is to cause the muscle to become thinner. Remove part of the wall that divides the right and left sides of the heart (septum) with a procedure called septal myectomy. Replace the mitral valve. Replace your heart with a healthy heart (heart transplant). Follow these instructions at home: Activity Avoid exercise and activities that take a lot of effort, such as shoveling snow. Do not participate in high-intensity activities, such as competitive sports. Get regular physical activity. Most people with this condition can participate in exercise at low to moderate intensity levels, such as walking. Ask your health care provider what activity level is safe for you. Lifestyle Maintain a healthy weight. If you need help with losing weight, ask your health care provider. Eat a heart-healthy diet. Do not drink alcohol if: Your health care provider tells you not to drink. You are pregnant, may be pregnant, or are planning to become pregnant. If you drink alcohol: Limit how much you have to: 0-1 drink a day for women. 0-2 drinks a day for men. Know how much alcohol is in your drink. In the [place] one drink equals one 12 oz bottle of beer (355 mL), one 5 oz glass of wine (148 mL), or one 1 oz glass of hard liquor (44 mL). Do not use any products that contain nicotine or tobacco. These products include cigarettes, chewing tobacco, and vaping devices, such as e-cigarettes. If you need help quitting, ask your health care provider. General instructions Make sure the members of your household know how to do CPR in case of an emergency. Take over-the-counter and prescription medicines only as told by your health care provider. Keep all follow-up visits. This is  important. Contact a health care provider if: You have new symptoms. Your symptoms get worse. Get help right away if: You have chest pain or shortness of breath, especially during or after exercise. You feel faint or you pass out. You have trouble breathing even at rest. Your feet or ankles swell. Your heartbeat seems irregular or seems faster than normal (palpitations). These symptoms may represent a serious problem that is an emergency. Do not wait to see if the symptoms will go away. Get medical help right away. Call your local emergency services (911 in the [place] Do not drive yourself to the hospital. Summary Hypertrophic cardiomyopathy (HCM) is a heart condition in which part of the heart muscle gets too thick. The condition can cause a dangerous and abnormal heart rhythm, and it can weaken the heart over time. Avoid exercise and activities that take a lot of effort, such as shoveling snow. Make sure the members of your household know how to do CPR in case of an emergency. Keep all follow-up visits. This is important. This information is not intended to replace advice given to you by your health care provider. Make sure you discuss any questions you have with your health care provider. Document Revised: 07/06/2020 Document Reviewed: 07/06/2020 Elsevier Patient Education  2023 [employer].

## 2021-10-28 ENCOUNTER — Other Ambulatory Visit: Payer: Self-pay | Admitting: Internal Medicine

## 2022-03-09 ENCOUNTER — Ambulatory Visit: Payer: No Typology Code available for payment source | Admitting: Internal Medicine

## 2022-03-16 ENCOUNTER — Other Ambulatory Visit: Payer: Self-pay

## 2022-03-16 ENCOUNTER — Emergency Department (HOSPITAL_COMMUNITY): Payer: BLUE CROSS/BLUE SHIELD

## 2022-03-16 ENCOUNTER — Encounter (HOSPITAL_COMMUNITY): Payer: Self-pay

## 2022-03-16 ENCOUNTER — Emergency Department (HOSPITAL_COMMUNITY)
Admission: EM | Admit: 2022-03-16 | Discharge: 2022-03-16 | Disposition: A | Discharge status: Home / Self Care | Payer: BLUE CROSS/BLUE SHIELD | Attending: Student | Admitting: Student

## 2022-03-16 DIAGNOSIS — I129 Hypertensive chronic kidney disease with stage 1 through stage 4 chronic kidney disease, or unspecified chronic kidney disease: Secondary | ICD-10-CM | POA: Insufficient documentation

## 2022-03-16 DIAGNOSIS — Z79899 Other long term (current) drug therapy: Secondary | ICD-10-CM | POA: Insufficient documentation

## 2022-03-16 DIAGNOSIS — R0789 Other chest pain: Secondary | ICD-10-CM | POA: Insufficient documentation

## 2022-03-16 DIAGNOSIS — Z87891 Personal history of nicotine dependence: Secondary | ICD-10-CM | POA: Insufficient documentation

## 2022-03-16 DIAGNOSIS — N189 Chronic kidney disease, unspecified: Secondary | ICD-10-CM | POA: Diagnosis not present

## 2022-03-16 DIAGNOSIS — E1122 Type 2 diabetes mellitus with diabetic chronic kidney disease: Secondary | ICD-10-CM | POA: Insufficient documentation

## 2022-03-16 DIAGNOSIS — Z7984 Long term (current) use of oral hypoglycemic drugs: Secondary | ICD-10-CM | POA: Insufficient documentation

## 2022-03-16 DIAGNOSIS — Z7982 Long term (current) use of aspirin: Secondary | ICD-10-CM | POA: Insufficient documentation

## 2022-03-16 LAB — CBC
HCT: 38.3 % (ref 36.0–46.0)
Hemoglobin: 12.7 g/dL (ref 12.0–15.0)
MCH: 29.5 pg (ref 26.0–34.0)
MCHC: 33.2 g/dL (ref 30.0–36.0)
MCV: 89.1 fL (ref 80.0–100.0)
Platelets: 286 10*3/uL (ref 150–400)
RBC: 4.3 MIL/uL (ref 3.87–5.11)
RDW: 13.8 % (ref 11.5–15.5)
WBC: 6.8 10*3/uL (ref 4.0–10.5)
nRBC: 0 % (ref 0.0–0.2)

## 2022-03-16 LAB — TROPONIN I (HIGH SENSITIVITY)
Troponin I (High Sensitivity): 4 ng/L (ref <18)
Troponin I (High Sensitivity): 4 ng/L (ref <18)

## 2022-03-16 LAB — BASIC METABOLIC PANEL
Anion gap: 8 (ref 5–15)
BUN: 12 mg/dL (ref 6–20)
CO2: 27 mmol/L (ref 22–32)
Calcium: 10.4 mg/dL — ABNORMAL HIGH (ref 8.9–10.3)
Chloride: 102 mmol/L (ref 98–111)
Creatinine, Ser: 0.98 mg/dL (ref 0.44–1.00)
GFR, Estimated: >60 mL/min (ref >60)
Glucose, Bld: 164 mg/dL — ABNORMAL HIGH (ref 70–99)
Potassium: 3.8 mmol/L (ref 3.5–5.1)
Sodium: 137 mmol/L (ref 135–145)

## 2022-03-16 MED ORDER — ALUM & MAG HYDROXIDE-SIMETH 200-200-20 MG/5ML PO SUSP
30.0000 mL | Freq: Once | ORAL | Status: AC
  Administered 2022-03-16: 30 mL via ORAL
  Filled 2022-03-16: qty 30

## 2022-03-16 MED ORDER — FAMOTIDINE 20 MG PO TABS: 20.0000 mg | ORAL_TABLET | Freq: Two times a day (BID) | ORAL | 0 refills | Status: AC

## 2022-03-16 MED ORDER — LIDOCAINE VISCOUS HCL 2 % MT SOLN
15.0000 mL | Freq: Once | OROMUCOSAL | Status: AC
  Administered 2022-03-16: 15 mL via ORAL
  Filled 2022-03-16: qty 15

## 2022-03-16 NOTE — ED Notes (Signed)
Provided patient and family member with beverages

## 2022-03-16 NOTE — ED Notes (Signed)
Assumed care of patient at this time

## 2022-03-16 NOTE — ED Triage Notes (Signed)
Pt reports constant central, non-radiating chest pain since Saturday with associated sob, even while at rest, and feeling an irregular heart beat. Hx obstructive hypertrophic cardiomyopathy.

## 2022-03-16 NOTE — ED Provider Triage Note (Signed)
Emergency Medicine Provider Triage Evaluation Note  Rachel Brock , a 62 y.o. female  was evaluated in triage.  Pt complains of chest pain and shortness of breath that began on Saturday.  Patient reports chest pain began after getting to an argument with a friend.  Patient states that she had chest pain for 2 hours.  Patient reports that the chest pain resolved, she had no chest pain on Sunday Monday or Tuesday.  Patient reports that today she was sitting down when she had return of chest pain.  Patient states chest pain is left-sided, does not radiate, is associated with shortness of breath.  Patient reports history of hokum, followed by Dr. Harl Bowie.  Patient denies lightheadedness, dizziness, weakness, syncope, nausea, vomiting, abdominal pain, leg swelling.  Review of Systems  Positive:  Negative:   Physical Exam  BP (!) 145/83   Pulse 68   Temp 98 F (36.7 C) (Oral)   Resp 18   SpO2 98%  Gen:   Awake, no distress   Resp:  Normal effort  MSK:   Moves extremities without difficulty  Other:    Medical Decision Making  Medically screening exam initiated at 5:20 PM.  Appropriate orders placed.  Olivya A Holliman was informed that the remainder of the evaluation will be completed by another provider, this initial triage assessment does not replace that evaluation, and the importance of remaining in the ED until their evaluation is complete.     Azucena Cecil, PA-C 03/16/22 1721

## 2022-03-17 NOTE — ED Provider Notes (Signed)
[facility] Provider Note  CSN: [id number] Arrival date & time: 03/16/22 1659  Chief Complaint(s) Chest Pain  HPI Rachel Brock is a 62 y.o. female with PMH CKD, T2DM, HTN, HLD, HOCM  but with a preserved EF who presents emergency department for evaluation of chest pain.  Patient states that approximately 1 week ago she got into an argument with a friend and had centralized chest pain.  Her chest pain ultimately resolved after about 2 hours but then today when sitting down and after eating a big potato she had return of her centralized chest pain.  She states that this was associated with mild shortness of breath and diaphoresis but has mostly resolved.  She currently endorses a mild burning achy chest pain and is concerned given her history of hypertrophic obstructive cardiomyopathy that she may be having a cardiac event.  She currently denies shortness of breath, abdominal pain, nausea, vomiting, headache, fever or other systemic symptoms.  She states that she feels calm and is not particularly anxious.   Past Medical History Past Medical History:  Diagnosis Date   Chronic kidney disease    Diabetes mellitus without complication ([place])    Gout    High cholesterol    Hypertension    Kidney stones    Patient Active Problem List   Diagnosis Date Noted   Obstructive hypertrophic cardiomyopathy ([place]) 09/02/2021   Home Medication(s) Prior to Admission medications   Medication Sig Start Date End Date Taking? Authorizing Provider  famotidine (PEPCID) 20 MG tablet Take 1 tablet (20 mg total) by mouth 2 (two) times daily. 03/16/22  Yes Christeen Lai, MD  amLODipine (NORVASC) 10 MG tablet Take 10 mg by mouth daily. 04/06/21   [provider]  aspirin EC 81 MG tablet Take 81 mg by mouth daily.    [provider]  atorvastatin (LIPITOR) 20 MG tablet Take 20 mg by mouth at bedtime. 06/25/21   [provider]  furosemide  (LASIX) 20 MG tablet Take 1 tablet by mouth once daily 10/28/21   Janina Mayo, MD  hydrochlorothiazide (HYDRODIURIL) 25 MG tablet Take 12.5 mg by mouth daily. 05/11/21   [provider]  losartan (COZAAR) 100 MG tablet Take 100 mg by mouth daily. 04/06/21   [provider]  metFORMIN (GLUCOPHAGE) 500 MG tablet Take 500 mg by mouth 2 (two) times daily with a meal.    [provider]  metoprolol tartrate (LOPRESSOR) 25 MG tablet Take 1 tablet (25 mg total) by mouth 2 (two) times daily. 09/02/21   Janina Mayo, MD  ondansetron (ZOFRAN ODT) 4 MG disintegrating tablet Take 1 tablet (4 mg total) by mouth every 8 (eight) hours as needed for nausea or vomiting. 05/06/19   Joy, Shawn C, PA-C  oxybutynin (DITROPAN-XL) 10 MG 24 hr tablet Take 10 mg by mouth daily. 05/17/21   [provider]  senna-docusate (SENOKOT-S) 8.6-50 MG tablet Take 1 tablet by mouth at bedtime as needed for mild constipation or moderate constipation. 08/13/19   Long, Wonda Olds, MD  tamsulosin (FLOMAX) 0.4 MG CAPS capsule Take 1 capsule (0.4 mg total) by mouth daily. 05/06/19   Joy, Shawn C, PA-C  traMADol (ULTRAM) 50 MG tablet Take 1 tablet (50 mg total) by mouth every 6 (six) hours as needed. 05/18/21   Lennice Sites, DO  Past Surgical History Past Surgical History:  Procedure Laterality Date   ABDOMINAL HYSTERECTOMY     Family History Family History  Problem Relation Age of Onset   Diabetes Mother    Stroke Mother    Hyperlipidemia Mother    Hypertension Mother    Heart disease Father    Heart attack Father    Diabetes Brother     Social History Social History   Tobacco Use   Smoking status: Former    Types: Cigarettes   Smokeless tobacco: Never  Vaping Use   Vaping Use: Never used  Substance Use Topics   Alcohol use: No   Drug use: No    Allergies Patient has no known allergies.  Review of Systems Review of Systems  Constitutional:  Positive for diaphoresis.  Respiratory:  Positive for shortness of breath.   Cardiovascular:  Positive for chest pain.    Physical Exam Vital Signs  I have reviewed the triage vital signs BP 138/66   Pulse (!) 58   Temp 98.2 F (36.8 C)   Resp 14   Ht 5' 1"$  (1.549 m)   Wt 81.6 kg   SpO2 98%   BMI 34.01 kg/m   Physical Exam Vitals and nursing note reviewed.  Constitutional:      General: She is not in acute distress.    Appearance: She is well-developed.  HENT:     Head: Normocephalic and atraumatic.  Eyes:     Conjunctiva/sclera: Conjunctivae normal.  Cardiovascular:     Rate and Rhythm: Normal rate and regular rhythm.     Heart sounds: Murmur heard.  Pulmonary:     Effort: Pulmonary effort is normal. No respiratory distress.     Breath sounds: Normal breath sounds.  Abdominal:     Palpations: Abdomen is soft.     Tenderness: There is no abdominal tenderness.  Musculoskeletal:        General: No swelling.     Cervical back: Neck supple.  Skin:    General: Skin is warm and dry.     Capillary Refill: Capillary refill takes less than 2 seconds.  Neurological:     Mental Status: She is alert.  Psychiatric:        Mood and Affect: Mood normal.     ED Results and Treatments Labs (all labs ordered are listed, but only abnormal results are displayed) Labs Reviewed  BASIC METABOLIC PANEL - Abnormal; Notable for the following components:      Result Value   Glucose, Bld 164 (*)    Calcium 10.4 (*)    All other components within normal limits  CBC  TROPONIN I (HIGH SENSITIVITY)  TROPONIN I (HIGH SENSITIVITY)                                                                                                                          Radiology DG Chest 1 View  Result Date: 03/16/2022 CLINICAL DATA:  Chest pain. History of heart  murmur and cardiomyopathy. Shortness  of breath. EXAM: CHEST  1 VIEW COMPARISON:  12/30/2016 FINDINGS: Normal heart size and pulmonary vascularity. No focal airspace disease or consolidation in the lungs. No blunting of costophrenic angles. No pneumothorax. Mediastinal contours appear intact. Degenerative changes in the spine and shoulders. IMPRESSION: No active disease. Electronically Signed   By: Lucienne Capers M.D.   On: 03/16/2022 18:24    Pertinent labs & imaging results that were available during my care of the patient were reviewed by me and considered in my medical decision making (see MDM for details).  Medications Ordered in ED Medications  alum & mag hydroxide-simeth (MAALOX/MYLANTA) 200-200-20 MG/5ML suspension 30 mL (30 mLs Oral Given 03/16/22 2151)    And  lidocaine (XYLOCAINE) 2 % viscous mouth solution 15 mL (15 mLs Oral Given 03/16/22 2151)                                                                                                                                     Procedures Procedures  (including critical care time)  Medical Decision Making / ED Course   This patient presents to the ED for concern of chest pain, this involves an extensive number of treatment options, and is a complaint that carries with it a high risk of complications and morbidity.  The differential diagnosis includes ACS, PE, pneumonia, GERD, anxiety, precordial catch, costochondritis, pericarditis  MDM: Patient seen emergency room for evaluation of chest pain.  Physical exam with a midsystolic murmur consistent with her history of HOCM but is otherwise unremarkable.  ECG with no ST elevations or depressions, no no evidence of ischemia.  Chest x-ray is unremarkable.  Laboratory evaluation including negative high-sensitivity troponin and delta troponin negative.  Patient does have a high risk structural history of HOCM, but yet her heart score remains fairly low at 4.  She has had no episodes of syncope or presyncope and I have overall  lower suspicion that this is at play here today.  She is low risk by Wells criteria and I have very low suspicion for PE.  She was given a GI cocktail leading to almost complete resolution of her symptoms.  We had a long discussion about staying in the hospital versus following up outpatient with cardiology and using shared decision making, we decided that the patient's symptoms are not very consistent with ACS today given overall negative ER workup and she would be safe for more urgent cardiology follow-up.  Patient will also call her gastroenterologist and we will start with Pepcid supplementation and watch for symptom improvement at home.  She was given strict return precautions of which she voiced understanding she was ultimately discharged with outpatient follow-up.   Additional history obtained: -Additional history obtained from multiple family members -External records from outside source obtained and reviewed including: Chart review including previous notes, labs, imaging, consultation notes   Lab Tests: -I ordered, reviewed, and interpreted  labs.   The pertinent results include:   Labs Reviewed  BASIC METABOLIC PANEL - Abnormal; Notable for the following components:      Result Value   Glucose, Bld 164 (*)    Calcium 10.4 (*)    All other components within normal limits  CBC  TROPONIN I (HIGH SENSITIVITY)  TROPONIN I (HIGH SENSITIVITY)      EKG   EKG Interpretation  Date/Time:  Wednesday March 16 2022 17:07:46 EST Ventricular Rate:  60 PR Interval:  166 QRS Duration: 72 QT Interval:  370 QTC Calculation: 370 R Axis:   18 Text Interpretation: Normal sinus rhythm When compared with ECG of 30-Dec-2016 09:28, PREVIOUS ECG IS PRESENT Confirmed by Nolan Lasser ([id number]) on 03/16/2022 9:18:42 PM         Imaging Studies ordered: I ordered imaging studies including chest x-ray I independently visualized and interpreted imaging. I agree with the radiologist  interpretation   Medicines ordered and prescription drug management: Meds ordered this encounter  Medications   AND Linked Order Group    alum & mag hydroxide-simeth (MAALOX/MYLANTA) 200-200-20 MG/5ML suspension 30 mL    lidocaine (XYLOCAINE) 2 % viscous mouth solution 15 mL   famotidine (PEPCID) 20 MG tablet    Sig: Take 1 tablet (20 mg total) by mouth 2 (two) times daily.    Dispense:  30 tablet    Refill:  0    -I have reviewed the patients home medicines and have made adjustments as needed  Critical interventions none    Cardiac Monitoring: The patient was maintained on a cardiac monitor.  I personally viewed and interpreted the cardiac monitored which showed an underlying rhythm of: NSR  Social Determinants of Health:  Factors impacting patients care include: none   Reevaluation: After the interventions noted above, I reevaluated the patient and found that they have :improved  Co morbidities that complicate the patient evaluation  Past Medical History:  Diagnosis Date   Chronic kidney disease    Diabetes mellitus without complication ([facility])    Gout    High cholesterol    Hypertension    Kidney stones       Dispostion: I considered admission for this patient, but at this time, she does not meet inpatient criteria for admission and with a heart score of 4, we will opt for closer outpatient cardiology follow-up with her primary cardiologist Dr. Harl Bowie with return precautions.  Patient voiced understanding and she was discharged     Final Clinical Impression(s) / ED Diagnoses Final diagnoses:  Atypical chest pain     @PCDICTATION$ @    Teressa Lower, MD 03/17/22 1246

## 2022-03-23 ENCOUNTER — Ambulatory Visit: Payer: BLUE CROSS/BLUE SHIELD | Admitting: Internal Medicine

## 2022-04-11 ENCOUNTER — Encounter: Payer: Self-pay | Admitting: Internal Medicine

## 2022-04-11 ENCOUNTER — Ambulatory Visit: Payer: BLUE CROSS/BLUE SHIELD | Attending: Internal Medicine | Admitting: Internal Medicine

## 2022-04-11 ENCOUNTER — Ambulatory Visit: Payer: BLUE CROSS/BLUE SHIELD | Attending: Internal Medicine

## 2022-04-11 VITALS — BP 126/64 | HR 60 | Ht 61.0 in | Wt 188.0 lb

## 2022-04-11 DIAGNOSIS — R002 Palpitations: Secondary | ICD-10-CM

## 2022-04-11 MED ORDER — DILTIAZEM HCL 30 MG PO TABS: 30.0000 mg | ORAL_TABLET | Freq: Four times a day (QID) | ORAL | 1 refills | Status: DC | PRN

## 2022-04-11 MED ORDER — METOPROLOL SUCCINATE ER 50 MG PO TB24: 50.0000 mg | ORAL_TABLET | Freq: Every day | ORAL | 3 refills | Status: AC

## 2022-04-11 NOTE — Patient Instructions (Addendum)
Medication Instructions:  STOP Metoprolol Tartrate  START Metoprolol Succinate (Toprol-XL) 50 mg daily  START Diltiazem (Cardizem) 30 mg every 6 hours as needed for palpitations   *If you need a refill on your cardiac medications before your next appointment, please call your pharmacy*  Lab Work: NONE ordered at this time of appointment   If you have labs (blood work) drawn today and your tests are completely normal, you will receive your results only by: [place] (if you have MyChart) OR A paper copy in the mail If you have any lab test that is abnormal or we need to change your treatment, we will call you to review the results.  Testing/Procedures: ZIO AT Long term monitor-Live Telemetry  Your physician has requested you wear a ZIO patch monitor for 14 days.  This is a single patch monitor. [employer] supplies one patch monitor per enrollment. Additional  stickers are not available.  Please do not apply patch if you will be having a Nuclear Stress Test, Echocardiogram, Cardiac CT, MRI,  or Chest Xray during the period you would be wearing the monitor. The patch cannot be worn during  these tests. You cannot remove and re-apply the ZIO AT patch monitor.  Your ZIO patch monitor will be mailed 3 day USPS to your address on file. It may take 3-5 days to  receive your monitor after you have been enrolled.  Once you have received your monitor, please review the enclosed instructions. Your monitor has  already been registered assigning a specific monitor serial # to you.   Billing and Patient Assistance Program information  Theodore Demark has been supplied with any insurance information on record for billing. [employer] offers a sliding scale Patient Assistance Program for patients without insurance, or whose  insurance does not completely cover the cost of the ZIO patch monitor. You must apply for the  Patient Assistance Program to qualify for the discounted rate. To apply, call [employer] at  [phone],  select option 4, select option 2 , ask to apply for the Patient Assistance Program, (you can request an  interpreter if needed). [employer] will ask your household income and how many people are in your  household. [employer] will quote your out-of-pocket cost based on this information. They will also be able  to set up a 12 month interest free payment plan if needed.  Applying the monitor   Shave hair from upper left chest.  Hold the abrader disc by orange tab. Rub the abrader in 40 strokes over left upper chest as indicated in  your monitor instructions.  Clean area with 4 enclosed alcohol pads. Use all pads to ensure the area is cleaned thoroughly. Let  dry.  Apply patch as indicated in monitor instructions. Patch will be placed under collarbone on left side of  chest with arrow pointing upward.  Rub patch adhesive wings for 2 minutes. Remove the white label marked "1". Remove the white label  marked "2". Rub patch adhesive wings for 2 additional minutes.  While looking in a mirror, press and release button in center of patch. A small green light will flash 3-4  times. This will be your only indicator that the monitor has been turned on.  Do not shower for the first 24 hours. You may shower after the first 24 hours.  Press the button if you feel a symptom. You will hear a small click. Record Date, Time and Symptom in  the Patient Log.   Starting the Gateway  In  your kit there is a small plastic box the size of a cellphone. This is Airline pilot. It transmits all your  recorded data to [facility]. This box must always stay within 10 feet of you. Open the box and push the *  button. There will be a light that blinks orange and then green a few times. When the light stops  blinking, the Gateway is connected to the ZIO patch. Call [employer] at [phone] to confirm your monitor is transmitting.  Returning your monitor  Remove your patch and place it inside the [place]. In the  lower half of the Gateway there is a white  bag with prepaid postage on it. Place Gateway in bag and seal. Mail package back to [place] as soon as  possible. Your physician should have your final report approximately 7 days after you have mailed back  your monitor. Call [place] at [phone] if you have questions regarding your ZIO AT  patch monitor. Call them immediately if you see an orange light blinking on your monitor.  If your monitor falls off in less than 4 days, contact our Monitor department at [phone]. If your  monitor becomes loose or falls off after 4 days call [employer] at [phone] for suggestions on  securing your monitor  Follow-Up: At [facility], you and your health needs are our priority.  As part of our continuing mission to provide you with exceptional heart care, we have created designated Provider Care Teams.  These Care Teams include your primary Cardiologist (physician) and Advanced Practice Providers (APPs -  Physician Assistants and Nurse Practitioners) who all work together to provide you with the care you need, when you need it.  We recommend signing up for the patient portal called "MyChart".  Sign up information is provided on this After Visit Summary.  MyChart is used to connect with patients for Virtual Visits (Telemedicine).  Patients are able to view lab/test results, encounter notes, upcoming appointments, etc.  Non-urgent messages can be sent to your provider as well.   To learn more about what you can do with MyChart, go to [email or web address].    Your next appointment:   6 month(s)  Provider:   Janina Mayo, MD     Other Instructions

## 2022-04-11 NOTE — Progress Notes (Unsigned)
Enrolled for [employer] to mail a ZIO XT long term holter monitor to the patients address on file.  
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
## 2022-04-20 ENCOUNTER — Telehealth: Payer: Self-pay | Admitting: Internal Medicine

## 2022-04-20 ENCOUNTER — Other Ambulatory Visit: Payer: Self-pay | Admitting: *Deleted

## 2022-04-20 MED ORDER — HYDROCHLOROTHIAZIDE 25 MG PO TABS: 12.5000 mg | ORAL_TABLET | Freq: Every day | ORAL | 2 refills | Status: AC

## 2022-04-20 NOTE — Telephone Encounter (Signed)
*  STAT* If patient is at the pharmacy, call can be transferred to refill team.   1. Which medications need to be refilled? (please list name of each medication and dose if known) hydrochlorothiazide (HYDRODIURIL) 25 MG tablet   2. Which pharmacy/location (including street and city if local pharmacy) is medication to be sent to? [place] ([place]), [place] - [address]   3. Do they need a 30 day or 90 day supply? [place]

## 2022-04-26 ENCOUNTER — Ambulatory Visit: Payer: BLUE CROSS/BLUE SHIELD | Admitting: Podiatry

## 2022-05-02 DIAGNOSIS — R002 Palpitations: Secondary | ICD-10-CM | POA: Diagnosis not present

## 2022-06-08 ENCOUNTER — Encounter (HOSPITAL_COMMUNITY): Payer: Self-pay | Admitting: Emergency Medicine

## 2022-06-08 ENCOUNTER — Other Ambulatory Visit: Payer: Self-pay

## 2022-06-08 ENCOUNTER — Ambulatory Visit (HOSPITAL_COMMUNITY)
Admission: EM | Admit: 2022-06-08 | Discharge: 2022-06-08 | Disposition: A | Discharge status: Home / Self Care | Payer: BLUE CROSS/BLUE SHIELD | Attending: Emergency Medicine | Admitting: Emergency Medicine

## 2022-06-08 DIAGNOSIS — J40 Bronchitis, not specified as acute or chronic: Secondary | ICD-10-CM | POA: Diagnosis not present

## 2022-06-08 DIAGNOSIS — J069 Acute upper respiratory infection, unspecified: Secondary | ICD-10-CM

## 2022-06-08 MED ORDER — GUAIFENESIN ER 600 MG PO TB12: 1200.0000 mg | ORAL_TABLET | Freq: Two times a day (BID) | ORAL | 0 refills | Status: AC

## 2022-06-08 MED ORDER — BENZONATATE 100 MG PO CAPS: 100.0000 mg | ORAL_CAPSULE | Freq: Three times a day (TID) | ORAL | 0 refills | Status: AC

## 2022-06-08 MED ORDER — PREDNISONE 20 MG PO TABS: 40.0000 mg | ORAL_TABLET | Freq: Every day | ORAL | 0 refills | Status: AC

## 2022-06-08 MED ORDER — AZITHROMYCIN 250 MG PO TABS: 250.0000 mg | ORAL_TABLET | Freq: Every day | ORAL | 0 refills | Status: DC

## 2022-06-08 NOTE — Discharge Instructions (Signed)
Overall your symptoms are consistent with an upper respiratory infection.  Please take the antibiotics as prescribed and until finished.  You can take them with food to help fight gastrointestinal upset.  I also suggest Mucinex 1200 mg to help break up your nasal drainage and secretions, please ensure you are drinking at least 64 ounces of water daily on this medication to help further loosen your secretions.  For further symptomatic relief you can sleep with a humidifier, do warm saline gargles, and drink tea with honey.  The steroids will help with the inflammation.  Please return to clinic if you do not feel better despite the antibiotics, or you have any new concerning symptoms.

## 2022-06-08 NOTE — ED Provider Notes (Signed)
[facility]    CSN: [id number] Arrival date & time: 06/08/22  1054      History   Chief Complaint Chief Complaint  Patient presents with   URI    HPI Rachel Brock is a 62 y.o. female.   Patient presents to clinic for hot and cold chills, nasal congestion, headache, bilateral ear pain, sore throat, body aches, generalized weakness and a productive cough with mild wheezing.  She denies shortness of breath or chest pain.  Denies any recent sick contacts.  Reports her cough is productive with phlegm.   She denies any history of respiratory issues, is not a current smoker, previous smoker.    The history is provided by the patient and medical records.  URI Presenting symptoms: congestion, cough, fatigue and sore throat   Associated symptoms: wheezing     Past Medical History:  Diagnosis Date   Chronic kidney disease    Diabetes mellitus without complication (HCC)    Gout    High cholesterol    Hypertension    Kidney stones     Patient Active Problem List   Diagnosis Date Noted   Obstructive hypertrophic cardiomyopathy (HCC) 09/02/2021    Past Surgical History:  Procedure Laterality Date   ABDOMINAL HYSTERECTOMY      OB History   No obstetric history on file.      Home Medications    Prior to Admission medications   Medication Sig Start Date End Date Taking? Authorizing Provider  azithromycin (ZITHROMAX) 250 MG tablet Take 1 tablet (250 mg total) by mouth daily. Take first 2 tablets together, then 1 every day until finished. 06/08/22  Yes Rinaldo Ratel, [place] N, FNP  benzonatate (TESSALON) 100 MG capsule Take 1 capsule (100 mg total) by mouth every 8 (eight) hours. 06/08/22  Yes Rinaldo Ratel, [place] N, FNP  guaiFENesin (MUCINEX) 600 MG 12 hr tablet Take 2 tablets (1,200 mg total) by mouth 2 (two) times daily for 5 days. 06/08/22 06/13/22 Yes Rinaldo Ratel, [place] N, FNP  predniSONE (DELTASONE) 20 MG tablet Take 2 tablets (40 mg total) by mouth daily for 5  days. 06/08/22 06/13/22 Yes Rinaldo Ratel, [place] N, FNP  amLODipine (NORVASC) 10 MG tablet Take 10 mg by mouth daily. 04/06/21   [provider]  aspirin EC 81 MG tablet Take 81 mg by mouth daily.    [provider]  atorvastatin (LIPITOR) 20 MG tablet Take 20 mg by mouth at bedtime. 06/25/21   [provider]  diltiazem (CARDIZEM) 30 MG tablet Take 1 tablet (30 mg total) by mouth every 6 (six) hours as needed (palpitations). 04/11/22   Maisie Fus, MD  famotidine (PEPCID) 20 MG tablet Take 1 tablet (20 mg total) by mouth 2 (two) times daily. 03/16/22   Kommor, Wyn Forster, MD  furosemide (LASIX) 20 MG tablet Take 1 tablet by mouth once daily 10/28/21   Maisie Fus, MD  hydrochlorothiazide (HYDRODIURIL) 25 MG tablet Take 0.5 tablets (12.5 mg total) by mouth daily. 04/20/22   Maisie Fus, MD  losartan (COZAAR) 100 MG tablet Take 100 mg by mouth daily. 04/06/21   [provider]  metFORMIN (GLUCOPHAGE) 500 MG tablet Take 500 mg by mouth 2 (two) times daily with a meal.    [provider]  metoprolol succinate (TOPROL XL) 50 MG 24 hr tablet Take 1 tablet (50 mg total) by mouth daily. Take with or immediately following a meal. 04/11/22   Maisie Fus, MD  ondansetron (ZOFRAN ODT) 4  MG disintegrating tablet Take 1 tablet (4 mg total) by mouth every 8 (eight) hours as needed for nausea or vomiting. 05/06/19   Joy, Shawn C, PA-C  oxybutynin (DITROPAN-XL) 10 MG 24 hr tablet Take 10 mg by mouth daily. 05/17/21   [provider]  senna-docusate (SENOKOT-S) 8.6-50 MG tablet Take 1 tablet by mouth at bedtime as needed for mild constipation or moderate constipation. 08/13/19   Long, Arlyss Repress, MD  tamsulosin (FLOMAX) 0.4 MG CAPS capsule Take 1 capsule (0.4 mg total) by mouth daily. 05/06/19   Joy, Shawn C, PA-C  traMADol (ULTRAM) 50 MG tablet Take 1 tablet (50 mg total) by mouth every 6 (six) hours as needed. 05/18/21   Virgina Norfolk, DO    Family History Family  History  Problem Relation Age of Onset   Diabetes Mother    Stroke Mother    Hyperlipidemia Mother    Hypertension Mother    Heart disease Father    Heart attack Father    Diabetes Brother     Social History Social History   Tobacco Use   Smoking status: Former    Types: Cigarettes   Smokeless tobacco: Never  Vaping Use   Vaping Use: Never used  Substance Use Topics   Alcohol use: No   Drug use: No     Allergies   Patient has no known allergies.   Review of Systems Review of Systems  Constitutional:  Positive for chills and fatigue.  HENT:  Positive for congestion and sore throat.   Respiratory:  Positive for cough and wheezing. Negative for shortness of breath.   Cardiovascular:  Negative for chest pain.     Physical Exam Triage Vital Signs ED Triage Vitals  Enc Vitals Group     BP 06/08/22 1149 (!) 146/60     Pulse Rate 06/08/22 1149 60     Resp 06/08/22 1149 18     Temp 06/08/22 1149 97.8 F (36.6 C)     Temp Source 06/08/22 1149 Oral     SpO2 06/08/22 1149 98 %     Weight --      Height --      Head Circumference --      Peak Flow --      Pain Score 06/08/22 1146 8     Pain Loc --      Pain Edu? --      Excl. in GC? --    No data found.  Updated Vital Signs BP (!) 146/60 (BP Location: Right Arm) Comment: has not had medicines today  Pulse 60   Temp 97.8 F (36.6 C) (Oral)   Resp 18   SpO2 98%   Visual Acuity Right Eye Distance:   Left Eye Distance:   Bilateral Distance:    Right Eye Near:   Left Eye Near:    Bilateral Near:     Physical Exam Vitals and nursing note reviewed.  Constitutional:      Appearance: Normal appearance.  HENT:     Head: Normocephalic and atraumatic.     Right Ear: External ear normal. A middle ear effusion is present.     Left Ear: External ear normal. A middle ear effusion is present.     Nose: Congestion and rhinorrhea present.     Mouth/Throat:     Mouth: Mucous membranes are moist.     Pharynx:  Posterior oropharyngeal erythema present.  Eyes:     Conjunctiva/sclera: Conjunctivae normal.  Cardiovascular:  Rate and Rhythm: Normal rate and regular rhythm.     Heart sounds: Normal heart sounds. No murmur heard. Pulmonary:     Effort: Pulmonary effort is normal. No respiratory distress.     Breath sounds: Normal breath sounds.  Musculoskeletal:        General: Normal range of motion.     Cervical back: Normal range of motion.  Lymphadenopathy:     Cervical: Cervical adenopathy present.  Skin:    General: Skin is warm and dry.  Neurological:     General: No focal deficit present.     Mental Status: She is alert and oriented to person, place, and time.  Psychiatric:        Mood and Affect: Mood normal.        Behavior: Behavior normal. Behavior is cooperative.      UC Treatments / Results  Labs (all labs ordered are listed, but only abnormal results are displayed) Labs Reviewed - No data to display  EKG   Radiology No results found.  Procedures Procedures (including critical care time)  Medications Ordered in UC Medications - No data to display  Initial Impression / Assessment and Plan / UC Course  I have reviewed the triage vital signs and the nursing notes.  Pertinent labs & imaging results that were available during my care of the patient were reviewed by me and considered in my medical decision making (see chart for details).  Vitals and triage reviewed, patient is hemodynamically stable.  Lungs vesicular posteriorly.  Bilateral tympanic membranes are intact, normal external ear canal, bilateral middle ear effusions.  Suspect due to nasal congestion.  Patient reports wheezing, weakness and fatigue for the past 5 days.  Lungs vesicular posteriorly.  Will cover with Zpack for acute upper respiratory infection, steroid burst and Tessalon Perles as needed.  Symptomatic management discussed.  Plan of care, follow-up care, return precautions reviewed, no questions  at this time.     Final Clinical Impressions(s) / UC Diagnoses   Final diagnoses:  Acute URI  Bronchitis     Discharge Instructions      Overall your symptoms are consistent with an upper respiratory infection.  Please take the antibiotics as prescribed and until finished.  You can take them with food to help fight gastrointestinal upset.  I also suggest Mucinex 1200 mg to help break up your nasal drainage and secretions, please ensure you are drinking at least 64 ounces of water daily on this medication to help further loosen your secretions.  For further symptomatic relief you can sleep with a humidifier, do warm saline gargles, and drink tea with honey.  The steroids will help with the inflammation.  Please return to clinic if you do not feel better despite the antibiotics, or you have any new concerning symptoms.      ED Prescriptions     Medication Sig Dispense Auth. Provider   guaiFENesin (MUCINEX) 600 MG 12 hr tablet Take 2 tablets (1,200 mg total) by mouth 2 (two) times daily for 5 days. 20 tablet Rinaldo Ratel, [place] N, [place]   azithromycin (ZITHROMAX) 250 MG tablet Take 1 tablet (250 mg total) by mouth daily. Take first 2 tablets together, then 1 every day until finished. 6 tablet Rinaldo Ratel, [place] N, [place]   predniSONE (DELTASONE) 20 MG tablet Take 2 tablets (40 mg total) by mouth daily for 5 days. 10 tablet Rinaldo Ratel, [place] N, FNP   benzonatate (TESSALON) 100 MG capsule Take 1 capsule (100 mg total) by mouth  every 8 (eight) hours. 21 capsule Ladonte Verstraete, [place] N, [place]      PDMP not reviewed this encounter.   Kiylah Loyer, [place] N, [place] 06/08/22 1204

## 2022-06-08 NOTE — ED Triage Notes (Signed)
Symptoms started Saturday, 06/04/2022.  Patient reports nasal congestion, runny nose, headache, bilateral ear pain, sore throat, body aches, weakness and coughing.  Unknown if any fever, has not checked.   Initially thought to be allergies and then symptoms worsened.    Patient has had tylenol sinus severe, benadryl and thermaflu

## 2022-06-28 IMAGING — CT CT ABD-PELV W/O CM
1 of 2 series · 14 of 32 positions shown, 19 images · non-contrast
Comparison: 08/13/2019

CLINICAL DATA: Bilateral flank pain, chronic



[Series 2: renal standard/full · axial · 0.93mm/px · z∈[-539,-114]mm · 14 of 95 slices shown, 19 images]
[im 5/95  soft-tissue]
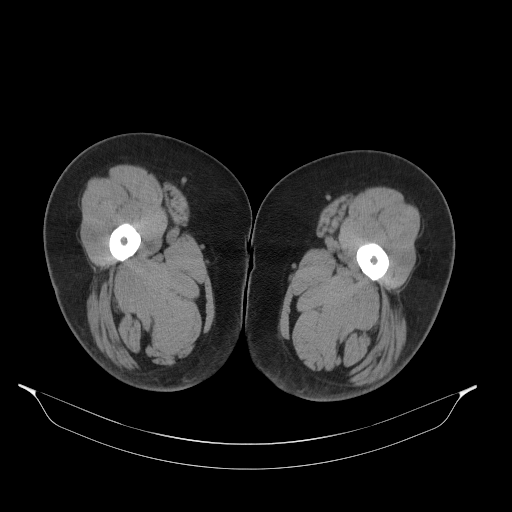
[im 5/95  bone]
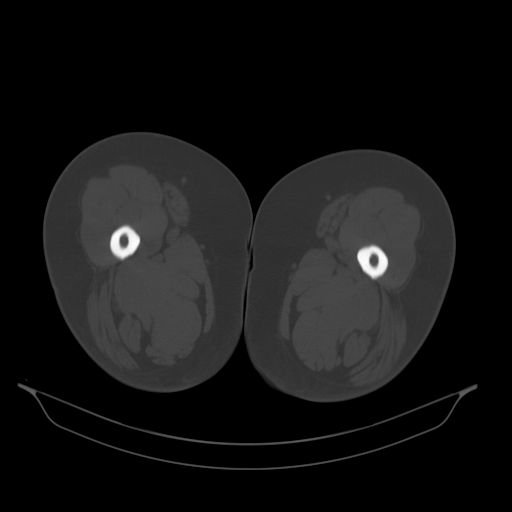
[im 15/95  soft-tissue]
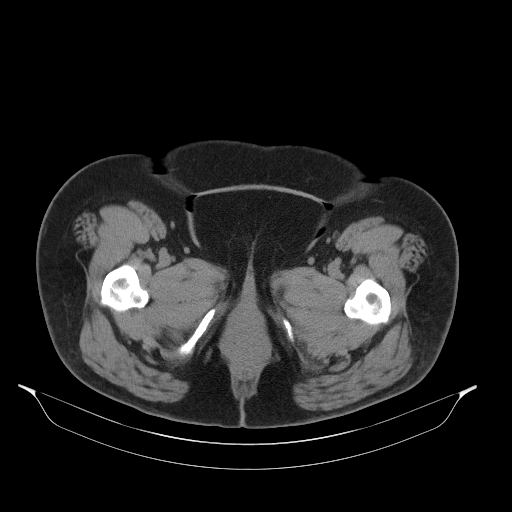
[im 20/95  soft-tissue]
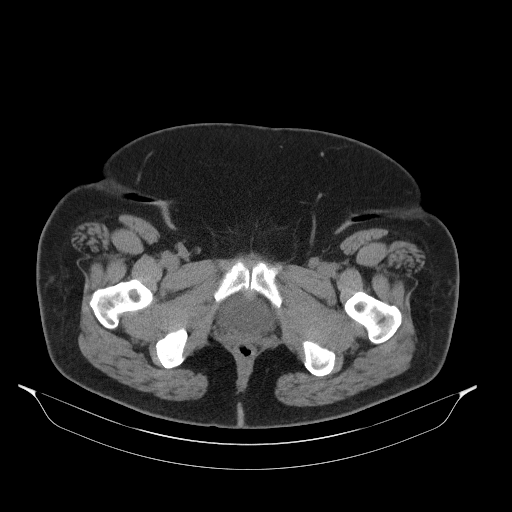
[im 25/95  soft-tissue]
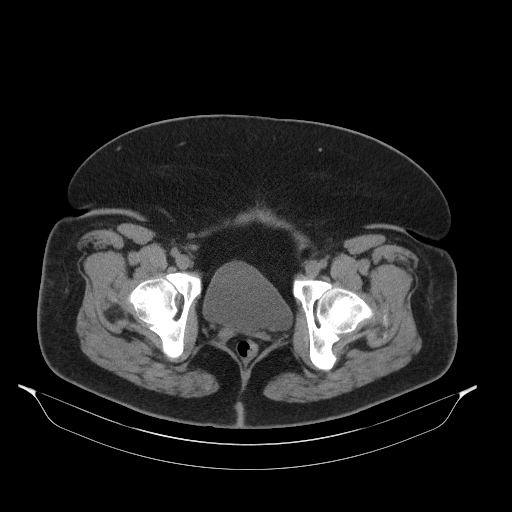
[im 35/95  soft-tissue]
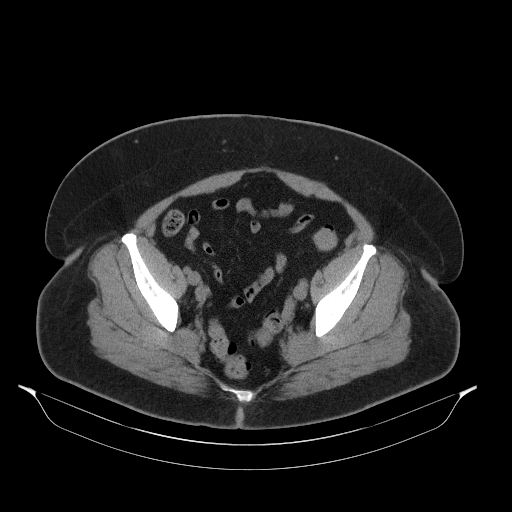
[im 40/95  soft-tissue]
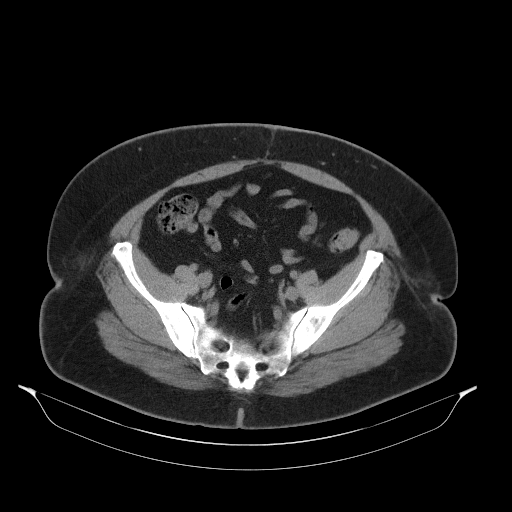
[im 50/95  soft-tissue]
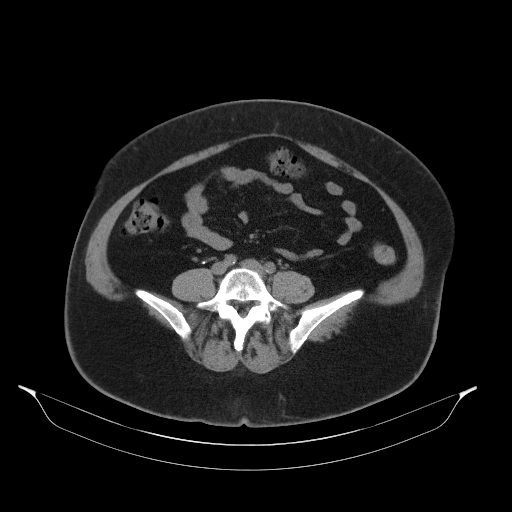
[im 55/95  soft-tissue]
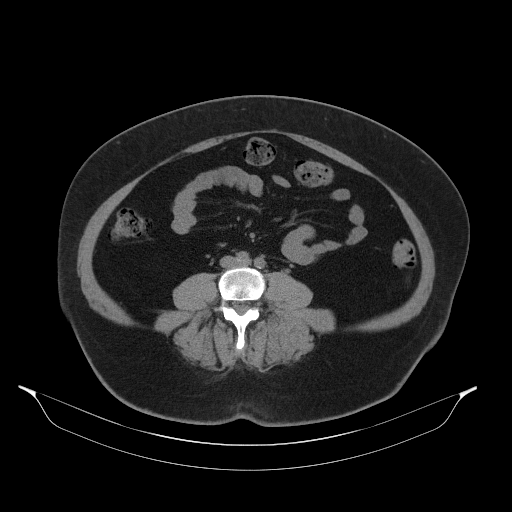
[im 60/95  soft-tissue]
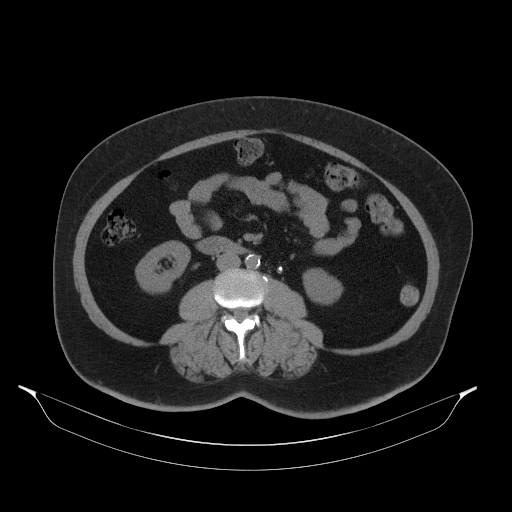
[im 60/95  bone]
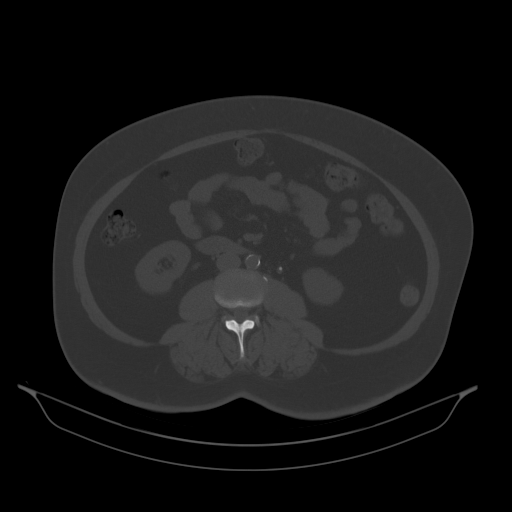
[im 70/95  soft-tissue]
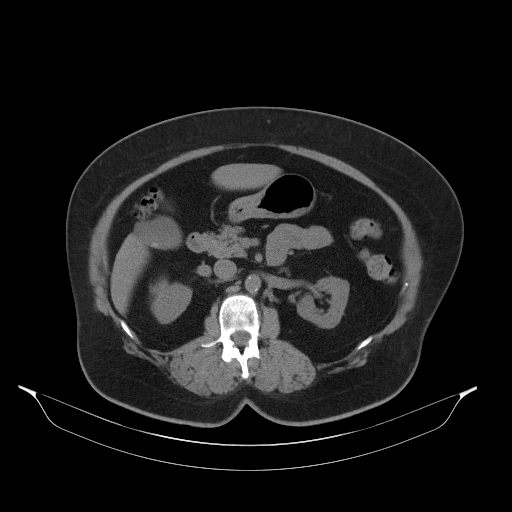
[im 75/95  soft-tissue]
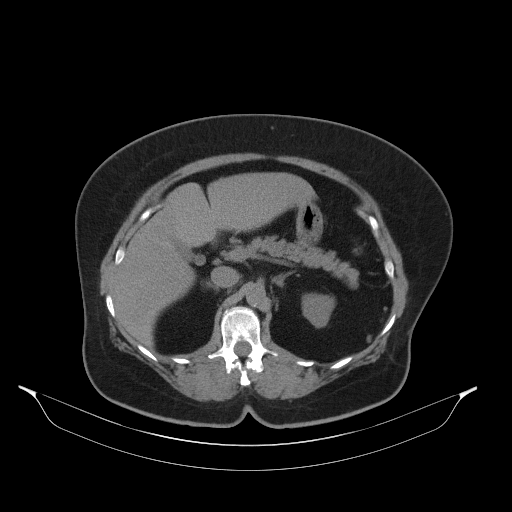
[im 75/95  lung]
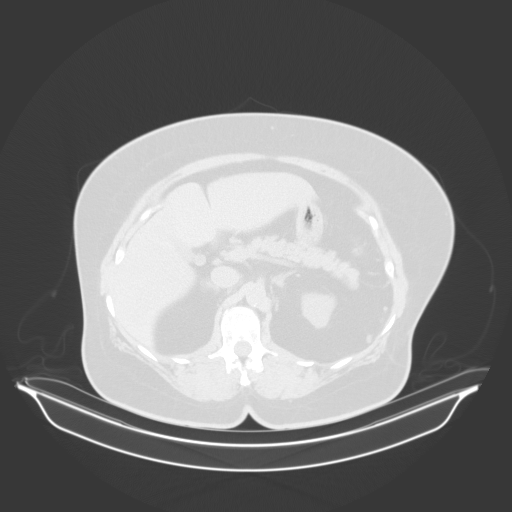
[im 80/95  soft-tissue]
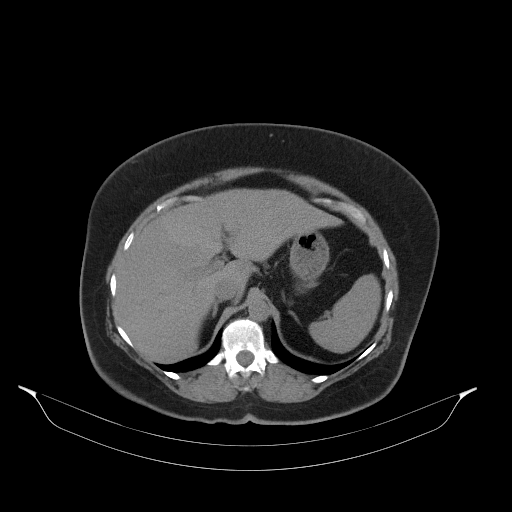
[im 80/95  lung]
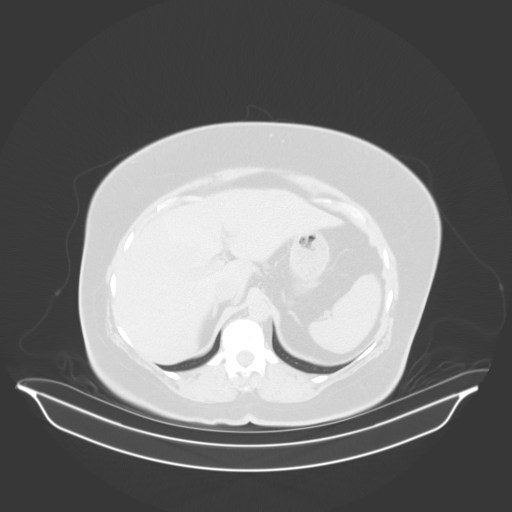
[im 85/95  lung]
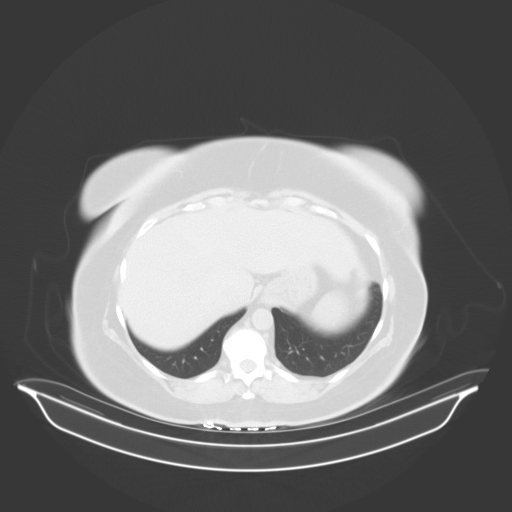
[im 90/95  soft-tissue]
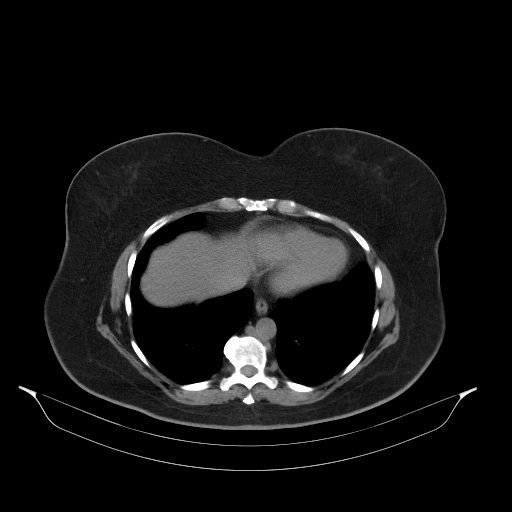
[im 90/95  lung]
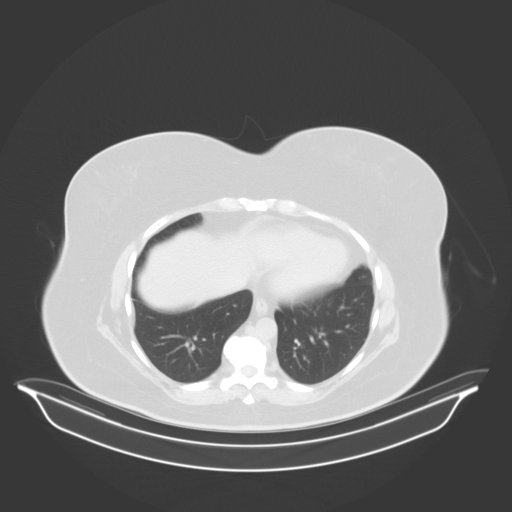

[14 of 32 positions shown; findings below may reference images not displayed]

FINDINGS: Lower chest: No acute abnormality

Hepatobiliary: Small layering gallstone within the gallbladder,
similar prior study. Areas of low-density throughout the liver
compatible with fatty infiltration. No suspicious focal hepatic
abnormality.

Pancreas: No focal abnormality or ductal dilatation.

Spleen: No focal abnormality.  Normal size.

Adrenals/Urinary Tract: No adrenal abnormality. No focal renal
abnormality. Punctate 1 mm stones in the lower poles of both kidneys
seen best on coronal imaging. No ureteral stones or hydronephrosis.
Urinary bladder is unremarkable.

Stomach/Bowel: Normal appendix. Stomach, large and small bowel
grossly unremarkable.

Vascular/Lymphatic: No evidence of aneurysm or adenopathy. Aortic
atherosclerosis.

Reproductive: Prior hysterectomy.  No adnexal masses.

Other: No free fluid or free air.

Musculoskeletal: No acute bony abnormality.
IMPRESSION: Punctate bilateral lower pole nephrolithiasis. No ureteral stones or
hydronephrosis.

Hepatic steatosis.

Cholelithiasis.

Aortic atherosclerosis.

No acute findings.

## 2022-06-29 ENCOUNTER — Telehealth: Payer: Self-pay | Admitting: Internal Medicine

## 2022-06-29 DIAGNOSIS — I517 Cardiomegaly: Secondary | ICD-10-CM

## 2022-06-29 NOTE — Telephone Encounter (Signed)
Patient is calling to get her MRI scheduled per Dr. Wyline Mood but no request has been entered into [facility]. It was a follow-up to this mychart message on 5/29:  Hi Ms. Sangiovanni-    I just wanted to follow up with you and see if you were interested in the further testing that Dr. Wyline Mood recommended. Please let me know and I can arrange this for you.    Please let me know if you have any issues, questions, or concerns.    Thank you & Take Care Jenna B. RN

## 2022-06-29 NOTE — Telephone Encounter (Signed)
Pt c/o medication issue:  1. Name of Medication: hydrochlorothiazide (HYDRODIURIL) 25 MG tablet   2. How are you currently taking this medication (dosage and times per day)? Take 0.5 tablets (12.5 mg total) by mouth daily.   3. Are you having a reaction (difficulty breathing--STAT)? No   4. What is your medication issue? Patient wants to see if she can take 1 tablet (25 MG) by mouth daily instead of 0.5 (12.5 MG) due to BP still being high. Says that her BP works better with the 1 tablet instead of 0.5 tablet. Also please send in 90 day prescription to  [facility] - [place] - [address]

## 2022-06-29 NOTE — Telephone Encounter (Signed)
Patient called and stated yesterday her blood pressure was 169/70. She would like to increase her hydrochlorothiazide. I did advise we will need more than just one reading to see a trend. Advised to check blood pressure everyday being that she stated she only take it 3 times a week. Keep log of readings for the provider to review over the next two weeks. Will send to provider for review

## 2022-06-29 NOTE — Telephone Encounter (Signed)
Left message for the patient to call the clinic.

## 2022-06-29 NOTE — Telephone Encounter (Signed)
Left voicemail to return call to office.

## 2022-06-30 NOTE — Telephone Encounter (Signed)
Patient is returning call.  °

## 2022-06-30 NOTE — Telephone Encounter (Signed)
Returned call to patient, patient would like to proceed with Cardiac MRI- order placed.   Maisie Fus, MD 05/20/2022  8:29 AM EDT     Cleone Slim Eileen Stanford, please let Mrs Flathers know that she had occasional skipped beats. She can continue diltiazem as needed. Recommend hydration with electrolytes. Further, we discussed she had some thickening of her heart. To assess the cause I recommend a cardiac MRI    Advised patient to call back to office with any issues, questions, or concerns. Patient verbalized understanding.

## 2022-06-30 NOTE — Telephone Encounter (Signed)
LVM for patient to call back regarding testing per message

## 2022-06-30 NOTE — Telephone Encounter (Signed)
Left voicemail for patient to return call to office. 

## 2022-07-01 NOTE — Telephone Encounter (Signed)
Spoke with patient yesterday regarding cardiac MRI- order placed please see alternate encounter

## 2022-07-01 NOTE — Telephone Encounter (Signed)
Attempted to call patient, left message for patient to call back to office.  Message sent to patient via MyChart with Dr. Verna Czech recommendations  .  Maisie Fus, MD  You; Maryclare Bean, Demetris R, LPN9 minutes ago (9:53 AM)   MB Agree with creating a log for BP

## 2022-08-10 ENCOUNTER — Emergency Department (HOSPITAL_BASED_OUTPATIENT_CLINIC_OR_DEPARTMENT_OTHER): Payer: BLUE CROSS/BLUE SHIELD

## 2022-08-10 ENCOUNTER — Emergency Department (HOSPITAL_BASED_OUTPATIENT_CLINIC_OR_DEPARTMENT_OTHER)
Admission: EM | Admit: 2022-08-10 | Discharge: 2022-08-10 | Disposition: A | Discharge status: Home / Self Care | Payer: BLUE CROSS/BLUE SHIELD | Attending: Emergency Medicine | Admitting: Emergency Medicine

## 2022-08-10 ENCOUNTER — Encounter (HOSPITAL_BASED_OUTPATIENT_CLINIC_OR_DEPARTMENT_OTHER): Payer: Self-pay

## 2022-08-10 ENCOUNTER — Other Ambulatory Visit: Payer: Self-pay

## 2022-08-10 DIAGNOSIS — K8050 Calculus of bile duct without cholangitis or cholecystitis without obstruction: Secondary | ICD-10-CM

## 2022-08-10 DIAGNOSIS — K8020 Calculus of gallbladder without cholecystitis without obstruction: Secondary | ICD-10-CM | POA: Insufficient documentation

## 2022-08-10 DIAGNOSIS — Z79899 Other long term (current) drug therapy: Secondary | ICD-10-CM | POA: Diagnosis not present

## 2022-08-10 DIAGNOSIS — I129 Hypertensive chronic kidney disease with stage 1 through stage 4 chronic kidney disease, or unspecified chronic kidney disease: Secondary | ICD-10-CM | POA: Diagnosis not present

## 2022-08-10 DIAGNOSIS — Z7982 Long term (current) use of aspirin: Secondary | ICD-10-CM | POA: Diagnosis not present

## 2022-08-10 DIAGNOSIS — E1122 Type 2 diabetes mellitus with diabetic chronic kidney disease: Secondary | ICD-10-CM | POA: Diagnosis not present

## 2022-08-10 DIAGNOSIS — N189 Chronic kidney disease, unspecified: Secondary | ICD-10-CM | POA: Diagnosis not present

## 2022-08-10 DIAGNOSIS — R109 Unspecified abdominal pain: Secondary | ICD-10-CM | POA: Diagnosis present

## 2022-08-10 LAB — URINALYSIS, ROUTINE W REFLEX MICROSCOPIC
Bilirubin Urine: NEGATIVE
Glucose, UA: NEGATIVE mg/dL
Leukocytes,Ua: NEGATIVE
Nitrite: NEGATIVE
Specific Gravity, Urine: 1.023 (ref 1.005–1.030)
pH: 5.5 (ref 5.0–8.0)

## 2022-08-10 LAB — COMPREHENSIVE METABOLIC PANEL
ALT: 13 U/L (ref 0–44)
AST: 16 U/L (ref 15–41)
Albumin: 4.1 g/dL (ref 3.5–5.0)
Alkaline Phosphatase: 59 U/L (ref 38–126)
Anion gap: 8 (ref 5–15)
BUN: 13 mg/dL (ref 6–20)
CO2: 26 mmol/L (ref 22–32)
Calcium: 10.9 mg/dL — ABNORMAL HIGH (ref 8.9–10.3)
Chloride: 108 mmol/L (ref 98–111)
Creatinine, Ser: 1.04 mg/dL — ABNORMAL HIGH (ref 0.44–1.00)
GFR, Estimated: >60 mL/min (ref >60)
Glucose, Bld: 89 mg/dL (ref 70–99)
Potassium: 4 mmol/L (ref 3.5–5.1)
Sodium: 142 mmol/L (ref 135–145)
Total Bilirubin: 0.4 mg/dL (ref 0.3–1.2)
Total Protein: 7.8 g/dL (ref 6.5–8.1)

## 2022-08-10 LAB — CBC
HCT: 42.1 % (ref 36.0–46.0)
Hemoglobin: 13.9 g/dL (ref 12.0–15.0)
MCH: 29.3 pg (ref 26.0–34.0)
MCHC: 33 g/dL (ref 30.0–36.0)
MCV: 88.6 fL (ref 80.0–100.0)
Platelets: 307 10*3/uL (ref 150–400)
RBC: 4.75 MIL/uL (ref 3.87–5.11)
RDW: 14.5 % (ref 11.5–15.5)
WBC: 4.8 10*3/uL (ref 4.0–10.5)
nRBC: 0 % (ref 0.0–0.2)

## 2022-08-10 LAB — LIPASE, BLOOD: Lipase: <10 U/L — ABNORMAL LOW (ref 11–51)

## 2022-08-10 MED ORDER — ACETAMINOPHEN 325 MG PO TABS
650.0000 mg | ORAL_TABLET | Freq: Once | ORAL | Status: AC
  Administered 2022-08-10: 650 mg via ORAL
  Filled 2022-08-10: qty 2

## 2022-08-10 MED ORDER — ONDANSETRON HCL 4 MG/2ML IJ SOLN
4.0000 mg | Freq: Once | INTRAMUSCULAR | Status: AC
  Administered 2022-08-10: 4 mg via INTRAVENOUS
  Filled 2022-08-10: qty 2

## 2022-08-10 NOTE — ED Provider Notes (Signed)
[name] [facility] AT [facility] Provider Note   CSN: [id number] Arrival date & time: 08/10/22  1631     History  Chief Complaint  Patient presents with   Abdominal Pain    Rachel Brock is a 62 y.o. female.   Abdominal Pain Patient has right side abdominal pain.  Does go to the back.  States it feels like her gallstones.  States also has kidney stones.  Began around 4 days ago.  States she has had some pain worse after eating.  Has had some nausea.  Also pain in the flank.  Denies hematuria.    Past Medical History:  Diagnosis Date   Chronic kidney disease    Diabetes mellitus without complication (HCC)    Gout    High cholesterol    Hypertension    Kidney stones     Home Medications Prior to Admission medications   Medication Sig Start Date End Date Taking? Authorizing Provider  amLODipine (NORVASC) 10 MG tablet Take 10 mg by mouth daily. 04/06/21   [provider]  aspirin EC 81 MG tablet Take 81 mg by mouth daily.    [provider]  atorvastatin (LIPITOR) 20 MG tablet Take 20 mg by mouth at bedtime. 06/25/21   [provider]  azithromycin (ZITHROMAX) 250 MG tablet Take 1 tablet (250 mg total) by mouth daily. Take first 2 tablets together, then 1 every day until finished. 06/08/22   Garrison, [place] N, FNP  benzonatate (TESSALON) 100 MG capsule Take 1 capsule (100 mg total) by mouth every 8 (eight) hours. 06/08/22   Garrison, [place] N, FNP  diltiazem (CARDIZEM) 30 MG tablet Take 1 tablet (30 mg total) by mouth every 6 (six) hours as needed (palpitations). 04/11/22   Maisie Fus, MD  famotidine (PEPCID) 20 MG tablet Take 1 tablet (20 mg total) by mouth 2 (two) times daily. 03/16/22   Kommor, Wyn Forster, MD  furosemide (LASIX) 20 MG tablet Take 1 tablet by mouth once daily 10/28/21   Maisie Fus, MD  hydrochlorothiazide (HYDRODIURIL) 25 MG tablet Take 0.5 tablets (12.5 mg total) by mouth daily. 04/20/22   Maisie Fus, MD   losartan (COZAAR) 100 MG tablet Take 100 mg by mouth daily. 04/06/21   [provider]  metFORMIN (GLUCOPHAGE) 500 MG tablet Take 500 mg by mouth 2 (two) times daily with a meal.    [provider]  metoprolol succinate (TOPROL XL) 50 MG 24 hr tablet Take 1 tablet (50 mg total) by mouth daily. Take with or immediately following a meal. 04/11/22   Maisie Fus, MD  ondansetron (ZOFRAN ODT) 4 MG disintegrating tablet Take 1 tablet (4 mg total) by mouth every 8 (eight) hours as needed for nausea or vomiting. 05/06/19   Joy, Shawn C, PA-C  oxybutynin (DITROPAN-XL) 10 MG 24 hr tablet Take 10 mg by mouth daily. 05/17/21   [provider]  senna-docusate (SENOKOT-S) 8.6-50 MG tablet Take 1 tablet by mouth at bedtime as needed for mild constipation or moderate constipation. 08/13/19   Long, Arlyss Repress, MD  tamsulosin (FLOMAX) 0.4 MG CAPS capsule Take 1 capsule (0.4 mg total) by mouth daily. 05/06/19   Joy, Shawn C, PA-C  traMADol (ULTRAM) 50 MG tablet Take 1 tablet (50 mg total) by mouth every 6 (six) hours as needed. 05/18/21   Virgina Norfolk, DO      Allergies    Patient has no known allergies.    Review of Systems  Review of Systems  Gastrointestinal:  Positive for abdominal pain.    Physical Exam Updated Vital Signs BP (!) 151/70   Pulse (!) 59   Temp 98.2 F (36.8 C) (Oral)   Resp 18   SpO2 100%  Physical Exam Vitals reviewed.  HENT:     Head: Atraumatic.  Cardiovascular:     Rate and Rhythm: Normal rate.  Abdominal:     Hernia: No hernia is present.     Comments: Right upper quadrant tenderness that does go to right CVA area.  Skin:    General: Skin is warm.     Capillary Refill: Capillary refill takes less than 2 seconds.  Neurological:     Mental Status: She is alert.     ED Results / Procedures / Treatments   Labs (all labs ordered are listed, but only abnormal results are displayed) Labs Reviewed  LIPASE, BLOOD - Abnormal; Notable for the  following components:      Result Value   Lipase <10 (*)    All other components within normal limits  COMPREHENSIVE METABOLIC PANEL - Abnormal; Notable for the following components:   Creatinine, Ser 1.04 (*)    Calcium 10.9 (*)    All other components within normal limits  URINALYSIS, ROUTINE W REFLEX MICROSCOPIC - Abnormal; Notable for the following components:   Hgb urine dipstick TRACE (*)    Ketones, ur TRACE (*)    Protein, ur TRACE (*)    Bacteria, UA MANY (*)    All other components within normal limits  URINE CULTURE  CBC    EKG None  Radiology US RENAL  Result Date: 08/10/2022 CLINICAL DATA:  Right flank pain EXAM: RENAL / URINARY TRACT ULTRASOUND COMPLETE COMPARISON:  None Available. FINDINGS: Right Kidney: Renal measurements: 9.7 x 4.6 x 6.3 cm = volume: 148 mL. Echogenicity within normal limits. No mass or hydronephrosis visualized. No definite shadowing calculi are identified however Doppler artifact suggest the presence of a nonobstructing 5 mm calculus within the lower pole. Left Kidney: Renal measurements: 9.4 x 4.8 x 4.7 cm = volume: 112 mL. Echogenicity within normal limits. No mass or hydronephrosis visualized. Doppler artifacts suggest the presence of a 10 mm nonobstructing calculus within the lower pole of the left kidney. Bladder: Appears normal for degree of bladder distention. Other: None. IMPRESSION: 1. Suspected bilateral nonobstructing nephrolithiasis. This could be confirmed with CT imaging. No hydronephrosis. Electronically Signed   By: Helyn Numbers M.D.   On: 08/10/2022 21:50   US Abdomen Limited RUQ (LIVER/GB)  Result Date: 08/10/2022 CLINICAL DATA:  Right upper quadrant pain. EXAM: ULTRASOUND ABDOMEN LIMITED RIGHT UPPER QUADRANT COMPARISON:  05/25/2021. FINDINGS: Gallbladder: Stones and sludge are present within the gallbladder. No gallbladder wall thickening. No sonographic Murphy sign noted by sonographer. Common bile duct: Diameter: 4.5 mm Liver: No  focal lesion identified. Increased parenchymal echogenicity. Portal vein is patent on color Doppler imaging with normal direction of blood flow towards the liver. Other: No free fluid. IMPRESSION: 1. Cholelithiasis and gallbladder sludge without evidence of acute cholecystitis. 2. Hepatic steatosis. Electronically Signed   By: Thornell Sartorius M.D.   On: 08/10/2022 21:40    Procedures Procedures    Medications Ordered in ED Medications  ondansetron (ZOFRAN) injection 4 mg (4 mg Intravenous Given 08/10/22 2014)  acetaminophen (TYLENOL) tablet 650 mg (650 mg Oral Given 08/10/22 2324)    ED Course/ Medical Decision Making/ A&P  Medical Decision Making Amount and/or Complexity of Data Reviewed Labs: ordered. Radiology: ordered.  Risk OTC drugs. Prescription drug management.   Patient with right abdomen/flank pain.  Differential diagnosis includes biliary disease, kidney stones.  Had both previously.  Although I do see a HIDA scan previously that was negative.  I reviewed some previous notes.  Calcium mildly elevated.  Urinalysis showed trace blood although no red cells or white cells.  Did have many bacteria.  Will get ultrasound to evaluate for both biliary cause and potentially renal cause.  Does have gallstones but no cholecystitis.  Urine culture has been sent and will be followed.  I would treat if the culture does show infection.  Follow-up with general surgery for the gallbladder and can follow with PCP for the urine and potentially urology.        Final Clinical Impression(s) / ED Diagnoses Final diagnoses:  Biliary colic    Rx / DC Orders ED Discharge Orders     None         Benjiman Core, MD 08/10/22 2350

## 2022-08-10 NOTE — ED Notes (Signed)
 RN reviewed discharge instructions with pt. Pt verbalized understanding and had no further questions. VSS upon discharge.  

## 2022-08-10 NOTE — Discharge Instructions (Signed)
It looks as if you have some gallstones that may be giving you a problem.  Follow-up with general surgery.  Also the urine did have some bacteria and a little blood.  You will be notified if there is any infection that shows in the culture.

## 2022-08-10 NOTE — ED Triage Notes (Signed)
She c/o upper right abd. Pain plus nausea and lack of appetite, plus low grade fever at times since Saturday (four days ago). She tells me that she has known "small gallstones".

## 2022-08-12 ENCOUNTER — Other Ambulatory Visit: Payer: Self-pay | Admitting: Nurse Practitioner

## 2022-08-12 ENCOUNTER — Other Ambulatory Visit: Payer: Self-pay | Admitting: Physician Assistant

## 2022-08-12 DIAGNOSIS — Z1231 Encounter for screening mammogram for malignant neoplasm of breast: Secondary | ICD-10-CM

## 2022-08-12 LAB — URINE CULTURE: Culture: <10000 — AB

## 2022-08-19 ENCOUNTER — Ambulatory Visit: Payer: BLUE CROSS/BLUE SHIELD

## 2022-08-25 ENCOUNTER — Ambulatory Visit: Payer: BLUE CROSS/BLUE SHIELD

## 2022-09-23 ENCOUNTER — Ambulatory Visit (HOSPITAL_COMMUNITY): Payer: BLUE CROSS/BLUE SHIELD

## 2022-09-23 ENCOUNTER — Encounter (HOSPITAL_COMMUNITY): Payer: Self-pay

## 2022-10-03 ENCOUNTER — Other Ambulatory Visit: Payer: Self-pay | Admitting: *Deleted

## 2022-10-03 MED ORDER — FUROSEMIDE 20 MG PO TABS: 20.0000 mg | ORAL_TABLET | Freq: Every day | ORAL | 6 refills | Status: DC

## 2022-10-12 ENCOUNTER — Ambulatory Visit: Payer: BLUE CROSS/BLUE SHIELD | Admitting: Internal Medicine

## 2022-12-12 ENCOUNTER — Other Ambulatory Visit: Payer: Self-pay

## 2022-12-12 ENCOUNTER — Other Ambulatory Visit (HOSPITAL_BASED_OUTPATIENT_CLINIC_OR_DEPARTMENT_OTHER): Payer: Self-pay

## 2022-12-12 ENCOUNTER — Emergency Department (HOSPITAL_BASED_OUTPATIENT_CLINIC_OR_DEPARTMENT_OTHER): Payer: BLUE CROSS/BLUE SHIELD | Admitting: Radiology

## 2022-12-12 ENCOUNTER — Emergency Department (HOSPITAL_BASED_OUTPATIENT_CLINIC_OR_DEPARTMENT_OTHER)
Admission: EM | Admit: 2022-12-12 | Discharge: 2022-12-12 | Disposition: A | Discharge status: Home / Self Care | Payer: BLUE CROSS/BLUE SHIELD | Attending: Emergency Medicine | Admitting: Emergency Medicine

## 2022-12-12 ENCOUNTER — Encounter (HOSPITAL_BASED_OUTPATIENT_CLINIC_OR_DEPARTMENT_OTHER): Payer: Self-pay | Admitting: Emergency Medicine

## 2022-12-12 DIAGNOSIS — J40 Bronchitis, not specified as acute or chronic: Secondary | ICD-10-CM | POA: Diagnosis not present

## 2022-12-12 DIAGNOSIS — Z7982 Long term (current) use of aspirin: Secondary | ICD-10-CM | POA: Diagnosis not present

## 2022-12-12 DIAGNOSIS — R059 Cough, unspecified: Secondary | ICD-10-CM | POA: Diagnosis present

## 2022-12-12 DIAGNOSIS — Z20822 Contact with and (suspected) exposure to covid-19: Secondary | ICD-10-CM | POA: Diagnosis not present

## 2022-12-12 LAB — RESP PANEL BY RT-PCR (RSV, FLU A&B, COVID)  RVPGX2
Influenza A by PCR: NEGATIVE
Influenza B by PCR: NEGATIVE
Resp Syncytial Virus by PCR: NEGATIVE
SARS Coronavirus 2 by RT PCR: NEGATIVE

## 2022-12-12 MED ORDER — AEROCHAMBER PLUS FLO-VU MEDIUM MISC
1.0000 | Freq: Once | Status: AC
  Administered 2022-12-12: 1
  Filled 2022-12-12: qty 1

## 2022-12-12 MED ORDER — ALBUTEROL SULFATE HFA 108 (90 BASE) MCG/ACT IN AERS
2.0000 | INHALATION_SPRAY | Freq: Once | RESPIRATORY_TRACT | Status: AC
  Administered 2022-12-12: 2 via RESPIRATORY_TRACT
  Filled 2022-12-12: qty 6.7

## 2022-12-12 MED ORDER — HYDROCOD POLI-CHLORPHE POLI ER 10-8 MG/5ML PO SUER: 5.0000 mL | Freq: Two times a day (BID) | ORAL | 0 refills | Status: AC | PRN

## 2022-12-12 NOTE — Discharge Instructions (Signed)
Use Tussionex for cough twice daily as needed. Use the Albuterol inhaler for additional symptomatic relief if this helps with cough or chest tightness.   Tylenol and/or ibuprofen for aches and any fever. Drink lots of fluids and get adequate rest.   See your doctor if symptoms persist.

## 2022-12-12 NOTE — ED Notes (Signed)
Discharge paperwork given and verbally understood. 

## 2022-12-12 NOTE — ED Triage Notes (Signed)
Pt c/o cough, fever, with body aches, and congestion with shob x 4 days.

## 2022-12-12 NOTE — ED Provider Notes (Signed)
[place] EMERGENCY DEPARTMENT AT [facility] Provider Note   CSN: [id number] Arrival date & time: 12/12/22  1109     History  Chief Complaint  Patient presents with   Cough    Rachel Brock is a 62 y.o. female.  Patient to ED with symptoms fever, muscle aches, cough and congestion with chest tightness. Symptoms started 4 days ago. She reports being exposed to family members who have been ill with similar symptoms. No vomiting, diarrhea. She reports fever at home with Tmax 101.   The history is provided by the patient. No language interpreter was used.  Cough      Home Medications Prior to Admission medications   Medication Sig Start Date End Date Taking? Authorizing Provider  chlorpheniramine-HYDROcodone (TUSSIONEX) 10-8 MG/5ML Take 5 mLs by mouth every 12 (twelve) hours as needed for cough. 12/12/22  Yes Aedyn Kempfer, Melvenia Beam, PA-C  amLODipine (NORVASC) 10 MG tablet Take 10 mg by mouth daily. 04/06/21   [provider]  aspirin EC 81 MG tablet Take 81 mg by mouth daily.    [provider]  atorvastatin (LIPITOR) 20 MG tablet Take 20 mg by mouth at bedtime. 06/25/21   [provider]  azithromycin (ZITHROMAX) 250 MG tablet Take 1 tablet (250 mg total) by mouth daily. Take first 2 tablets together, then 1 every day until finished. 06/08/22   Garrison, [place] N, FNP  benzonatate (TESSALON) 100 MG capsule Take 1 capsule (100 mg total) by mouth every 8 (eight) hours. 06/08/22   Garrison, [place] N, FNP  diltiazem (CARDIZEM) 30 MG tablet Take 1 tablet (30 mg total) by mouth every 6 (six) hours as needed (palpitations). 04/11/22   Maisie Fus, MD  famotidine (PEPCID) 20 MG tablet Take 1 tablet (20 mg total) by mouth 2 (two) times daily. 03/16/22   Kommor, Madison, MD  furosemide (LASIX) 20 MG tablet Take 1 tablet (20 mg total) by mouth daily. 10/03/22   Maisie Fus, MD  hydrochlorothiazide (HYDRODIURIL) 25 MG tablet Take 0.5 tablets (12.5 mg total) by  mouth daily. 04/20/22   Maisie Fus, MD  losartan (COZAAR) 100 MG tablet Take 100 mg by mouth daily. 04/06/21   [provider]  metFORMIN (GLUCOPHAGE) 500 MG tablet Take 500 mg by mouth 2 (two) times daily with a meal.    [provider]  metoprolol succinate (TOPROL XL) 50 MG 24 hr tablet Take 1 tablet (50 mg total) by mouth daily. Take with or immediately following a meal. 04/11/22   Maisie Fus, MD  ondansetron (ZOFRAN ODT) 4 MG disintegrating tablet Take 1 tablet (4 mg total) by mouth every 8 (eight) hours as needed for nausea or vomiting. 05/06/19   Joy, Shawn C, PA-C  oxybutynin (DITROPAN-XL) 10 MG 24 hr tablet Take 10 mg by mouth daily. 05/17/21   [provider]  senna-docusate (SENOKOT-S) 8.6-50 MG tablet Take 1 tablet by mouth at bedtime as needed for mild constipation or moderate constipation. 08/13/19   Long, Arlyss Repress, MD  tamsulosin (FLOMAX) 0.4 MG CAPS capsule Take 1 capsule (0.4 mg total) by mouth daily. 05/06/19   Joy, Shawn C, PA-C  traMADol (ULTRAM) 50 MG tablet Take 1 tablet (50 mg total) by mouth every 6 (six) hours as needed. 05/18/21   Virgina Norfolk, DO      Allergies    Patient has no known allergies.    Review of Systems   Review of Systems  Respiratory:  Positive for cough.  Physical Exam Updated Vital Signs BP (!) 161/70 (BP Location: Right Arm)   Pulse 87   Temp 98.2 F (36.8 C)   Resp 16   SpO2 99%  Physical Exam Vitals and nursing note reviewed.  Constitutional:      Appearance: Normal appearance.  HENT:     Head: Normocephalic.     Nose: Rhinorrhea present.     Mouth/Throat:     Mouth: Mucous membranes are moist.     Pharynx: No oropharyngeal exudate or posterior oropharyngeal erythema.  Cardiovascular:     Rate and Rhythm: Normal rate and regular rhythm.     Heart sounds: No murmur heard. Pulmonary:     Effort: Pulmonary effort is normal.     Breath sounds: No wheezing, rhonchi or rales.     Comments: Actively  coughing. Musculoskeletal:     Cervical back: Normal range of motion and neck supple.  Neurological:     Mental Status: She is alert.     ED Results / Procedures / Treatments   Labs (all labs ordered are listed, but only abnormal results are displayed) Labs Reviewed  RESP PANEL BY RT-PCR (RSV, FLU A&B, COVID)  [id number]   Results for orders placed or performed during the hospital encounter of 12/12/22  Resp panel by RT-PCR (RSV, Flu A&B, Covid) Anterior Nasal Swab   Specimen: Anterior Nasal Swab  Result Value Ref Range   SARS Coronavirus 2 by RT PCR NEGATIVE NEGATIVE   Influenza A by PCR NEGATIVE NEGATIVE   Influenza B by PCR NEGATIVE NEGATIVE   Resp Syncytial Virus by PCR NEGATIVE NEGATIVE     EKG None  Radiology DG Chest 2 View  Result Date: 12/12/2022 CLINICAL DATA:  Productive cough. EXAM: CHEST - 2 VIEW COMPARISON:  03/16/2022. FINDINGS: Bilateral lung fields are clear. Bilateral costophrenic angles are clear. Normal cardio-mediastinal silhouette. No acute osseous abnormalities. The soft tissues are within normal limits. IMPRESSION: *No active cardiopulmonary disease. Electronically Signed   By: Jules Schick M.D.   On: 12/12/2022 15:37    Procedures Procedures    Medications Ordered in ED Medications  AeroChamber Plus Flo-Vu Medium MISC 1 each (has no administration in time range)  albuterol (VENTOLIN HFA) 108 (90 Base) MCG/ACT inhaler 2 puff (2 puffs Inhalation Given 12/12/22 1621)    ED Course/ Medical Decision Making/ A&P Clinical Course as of 12/12/22 1633  Mon Dec 12, 2022  1627 Patient to ED with URI symptoms of fever, cough, congestion and aches. Exposed to family member with same. Family  member is now well. Viral panel negative. CXR without pneumonia. VSS, no hypoxia. No evidence unstable illness. She can be discharged home with supportive care - Albuterol inhaler, Tussionex for cough. Close follow up with PCP recommended. Return precautions discussed.   [SU]    Clinical Course User Index [SU] Elpidio Anis, PA-C                                 Medical Decision Making Amount and/or Complexity of Data Reviewed Radiology: ordered.  Risk Prescription drug management.           Final Clinical Impression(s) / ED Diagnoses Final diagnoses:  Bronchitis    Rx / DC Orders ED Discharge Orders          Ordered    chlorpheniramine-HYDROcodone (TUSSIONEX) 10-8 MG/5ML  Every 12 hours PRN        12/12/22 1632  Elpidio Anis, PA-C 12/12/22 1633    Anders Simmonds T, DO 12/13/22 [phone]

## 2022-12-29 ENCOUNTER — Other Ambulatory Visit: Payer: Self-pay | Admitting: Physician Assistant

## 2022-12-29 DIAGNOSIS — Z1231 Encounter for screening mammogram for malignant neoplasm of breast: Secondary | ICD-10-CM

## 2023-01-04 ENCOUNTER — Encounter (HOSPITAL_COMMUNITY): Payer: Self-pay | Admitting: *Deleted

## 2023-01-04 ENCOUNTER — Ambulatory Visit (HOSPITAL_COMMUNITY)
Admission: EM | Admit: 2023-01-04 | Discharge: 2023-01-04 | Disposition: A | Discharge status: Home / Self Care | Payer: BLUE CROSS/BLUE SHIELD | Attending: Internal Medicine | Admitting: Internal Medicine

## 2023-01-04 ENCOUNTER — Other Ambulatory Visit: Payer: Self-pay

## 2023-01-04 DIAGNOSIS — N3000 Acute cystitis without hematuria: Secondary | ICD-10-CM | POA: Insufficient documentation

## 2023-01-04 LAB — POCT URINALYSIS DIP (MANUAL ENTRY)
Bilirubin, UA: NEGATIVE
Glucose, UA: 100 mg/dL — AB
Ketones, POC UA: NEGATIVE mg/dL
Leukocytes, UA: NEGATIVE
Nitrite, UA: NEGATIVE
Protein Ur, POC: NEGATIVE mg/dL
Spec Grav, UA: 1.02 (ref 1.010–1.025)
Urobilinogen, UA: 0.2 U/dL
pH, UA: 5.5 (ref 5.0–8.0)

## 2023-01-04 MED ORDER — NITROFURANTOIN MONOHYD MACRO 100 MG PO CAPS: 100.0000 mg | ORAL_CAPSULE | Freq: Two times a day (BID) | ORAL | 0 refills | Status: AC

## 2023-01-04 MED ORDER — PHENAZOPYRIDINE HCL 95 MG PO TABS: 95.0000 mg | ORAL_TABLET | Freq: Three times a day (TID) | ORAL | 0 refills | Status: AC | PRN

## 2023-01-04 NOTE — ED Provider Notes (Signed)
[facility]    CSN: [id number] Arrival date & time: 01/04/23  1400      History   Chief Complaint Chief Complaint  Patient presents with   Abdominal Pain   Dysuria   Urinary Urgency    HPI Rachel Brock is a 62 y.o. female.   Patient presents to clinic for lower abdominal discomfort, urgency, frequency and dysuria for the past 3 days.  Reports all of her symptoms were worse this morning.  She has a history of frequent urinary tract infections.  She did feel hot and cold, no fevers.  No flank pain.  No nausea or vomiting.  The history is provided by the patient and medical records.  Abdominal Pain Dysuria   Past Medical History:  Diagnosis Date   Chronic kidney disease    Diabetes mellitus without complication (HCC)    Gout    High cholesterol    Hypertension    Kidney stones     Patient Active Problem List   Diagnosis Date Noted   Obstructive hypertrophic cardiomyopathy (HCC) 09/02/2021    Past Surgical History:  Procedure Laterality Date   ABDOMINAL HYSTERECTOMY      OB History   No obstetric history on file.      Home Medications    Prior to Admission medications   Medication Sig Start Date End Date Taking? Authorizing Provider  amLODipine (NORVASC) 10 MG tablet Take 10 mg by mouth daily. 04/06/21  Yes [provider]  aspirin EC 81 MG tablet Take 81 mg by mouth daily.   Yes [provider]  atorvastatin (LIPITOR) 20 MG tablet Take 20 mg by mouth at bedtime. 06/25/21  Yes [provider]  diltiazem (CARDIZEM) 30 MG tablet Take 1 tablet (30 mg total) by mouth every 6 (six) hours as needed (palpitations). 04/11/22  Yes Maisie Fus, MD  famotidine (PEPCID) 20 MG tablet Take 1 tablet (20 mg total) by mouth 2 (two) times daily. 03/16/22  Yes Kommor, Madison, MD  furosemide (LASIX) 20 MG tablet Take 1 tablet (20 mg total) by mouth daily. 10/03/22  Yes Maisie Fus, MD  hydrochlorothiazide (HYDRODIURIL) 25 MG tablet  Take 0.5 tablets (12.5 mg total) by mouth daily. 04/20/22  Yes Maisie Fus, MD  losartan (COZAAR) 100 MG tablet Take 100 mg by mouth daily. 04/06/21  Yes [provider]  metFORMIN (GLUCOPHAGE) 500 MG tablet Take 500 mg by mouth 2 (two) times daily with a meal.   Yes [provider]  metoprolol succinate (TOPROL XL) 50 MG 24 hr tablet Take 1 tablet (50 mg total) by mouth daily. Take with or immediately following a meal. 04/11/22  Yes Branch, Alben Spittle, MD  nitrofurantoin, macrocrystal-monohydrate, (MACROBID) 100 MG capsule Take 1 capsule (100 mg total) by mouth 2 (two) times daily. 01/04/23  Yes Rinaldo Ratel, [place] N, FNP  ondansetron (ZOFRAN ODT) 4 MG disintegrating tablet Take 1 tablet (4 mg total) by mouth every 8 (eight) hours as needed for nausea or vomiting. 05/06/19  Yes Joy, Shawn C, PA-C  oxybutynin (DITROPAN-XL) 10 MG 24 hr tablet Take 10 mg by mouth daily. 05/17/21  Yes [provider]  phenazopyridine (PYRIDIUM) 95 MG tablet Take 1 tablet (95 mg total) by mouth 3 (three) times daily as needed for pain. 01/04/23  Yes Rinaldo Ratel, [place] N, FNP  senna-docusate (SENOKOT-S) 8.6-50 MG tablet Take 1 tablet by mouth at bedtime as needed for mild constipation or moderate constipation. 08/13/19  Yes Long, Arlyss Repress,  MD  tamsulosin (FLOMAX) 0.4 MG CAPS capsule Take 1 capsule (0.4 mg total) by mouth daily. 05/06/19  Yes Joy, Shawn C, PA-C  traMADol (ULTRAM) 50 MG tablet Take 1 tablet (50 mg total) by mouth every 6 (six) hours as needed. 05/18/21  Yes Curatolo, Adam, DO  azithromycin (ZITHROMAX) 250 MG tablet Take 1 tablet (250 mg total) by mouth daily. Take first 2 tablets together, then 1 every day until finished. 06/08/22   Delrose Rohwer, [place] N, FNP  benzonatate (TESSALON) 100 MG capsule Take 1 capsule (100 mg total) by mouth every 8 (eight) hours. 06/08/22   Fallen Crisostomo, [place] N, FNP  chlorpheniramine-HYDROcodone (TUSSIONEX) 10-8 MG/5ML Take 5 mLs by mouth every 12 (twelve) hours as needed  for cough. 12/12/22   Elpidio Anis, PA-C    Family History Family History  Problem Relation Age of Onset   Diabetes Mother    Stroke Mother    Hyperlipidemia Mother    Hypertension Mother    Heart disease Father    Heart attack Father    Diabetes Brother     Social History Social History   Tobacco Use   Smoking status: Former    Types: Cigarettes   Smokeless tobacco: Never  Vaping Use   Vaping status: Never Used  Substance Use Topics   Alcohol use: No   Drug use: No     Allergies   Patient has no known allergies.   Review of Systems Review of Systems  Per HPI   Physical Exam Triage Vital Signs ED Triage Vitals  Encounter Vitals Group     BP 01/04/23 1446 (!) 155/79     Systolic BP Percentile --      Diastolic BP Percentile --      Pulse Rate 01/04/23 1446 83     Resp 01/04/23 1446 20     Temp 01/04/23 1446 98.2 F (36.8 C)     Temp src --      SpO2 01/04/23 1446 97 %     Weight --      Height --      Head Circumference --      Peak Flow --      Pain Score 01/04/23 1443 9     Pain Loc --      Pain Education --      Exclude from Growth Chart --    No data found.  Updated Vital Signs BP (!) 155/79   Pulse 83   Temp 98.2 F (36.8 C)   Resp 20   SpO2 97%   Visual Acuity Right Eye Distance:   Left Eye Distance:   Bilateral Distance:    Right Eye Near:   Left Eye Near:    Bilateral Near:     Physical Exam Vitals and nursing note reviewed.  Constitutional:      Appearance: She is well-developed.  HENT:     Head: Normocephalic and atraumatic.     Mouth/Throat:     Mouth: Mucous membranes are moist.  Eyes:     General: No scleral icterus. Cardiovascular:     Rate and Rhythm: Normal rate.  Pulmonary:     Effort: Pulmonary effort is normal. No respiratory distress.  Abdominal:     Tenderness: There is no right CVA tenderness or left CVA tenderness.  Skin:    General: Skin is warm and dry.  Neurological:     General: No focal  deficit present.     Mental Status: She is alert.  Psychiatric:  Mood and Affect: Mood normal.      UC Treatments / Results  Labs (all labs ordered are listed, but only abnormal results are displayed) Labs Reviewed  POCT URINALYSIS DIP (MANUAL ENTRY) - Abnormal; Notable for the following components:      Result Value   Glucose, UA =100 (*)    Blood, UA moderate (*)    All other components within normal limits  URINE CULTURE    EKG   Radiology No results found.  Procedures Procedures (including critical care time)  Medications Ordered in UC Medications - No data to display  Initial Impression / Assessment and Plan / UC Course  I have reviewed the triage vital signs and the nursing notes.  Pertinent labs & imaging results that were available during my care of the patient were reviewed by me and considered in my medical decision making (see chart for details).  Vitals and triage reviewed, patient is hemodynamically stable.  Afebrile.  Without tachycardia.  Negative for CVA tenderness.  UA shows blood and glucose.  Due to symptoms of dysuria, urgency and frequency and suprapubic pain with urination will cover for acute cystitis with Macrobid.  Urine sent for culture to ensure adequate treatment.  Azo as needed for dysuria.  Plan of care, follow-up care and return precautions given, no questions at this time.     Final Clinical Impressions(s) / UC Diagnoses   Final diagnoses:  Acute cystitis without hematuria     Discharge Instructions      Your urine showed some blood, combined with your symptoms I am suspicious for urinary tract infection.  Please take all antibiotics as prescribed and until finished.  You can take the Pyridium up to 3 times daily to help kind of numb the urinary tract.  This may change the color of your urine and secretions.  We are sending your urine off for culture and we will contact you if we need to modify your antibiotic treatment.  If  your symptoms persist or change please return to clinic or follow-up with your primary care provider.      ED Prescriptions     Medication Sig Dispense Auth. Provider   nitrofurantoin, macrocrystal-monohydrate, (MACROBID) 100 MG capsule Take 1 capsule (100 mg total) by mouth 2 (two) times daily. 10 capsule Tricia Oaxaca, [place] N, FNP   phenazopyridine (PYRIDIUM) 95 MG tablet Take 1 tablet (95 mg total) by mouth 3 (three) times daily as needed for pain. 10 tablet Kazmir Oki, [place] N, FNP      PDMP not reviewed this encounter.   Chayim Bialas, [place] N, [place] 01/04/23 1534

## 2023-01-04 NOTE — ED Triage Notes (Signed)
Pt reports for last 3 days she has had dysuria ,ABD pain, urgency .

## 2023-01-04 NOTE — Discharge Instructions (Addendum)
Your urine showed some blood, combined with your symptoms I am suspicious for urinary tract infection.  Please take all antibiotics as prescribed and until finished.  You can take the Pyridium up to 3 times daily to help kind of numb the urinary tract.  This may change the color of your urine and secretions.  We are sending your urine off for culture and we will contact you if we need to modify your antibiotic treatment.  If your symptoms persist or change please return to clinic or follow-up with your primary care provider.

## 2023-01-05 LAB — URINE CULTURE: Culture: NO GROWTH

## 2023-01-26 ENCOUNTER — Ambulatory Visit: Payer: BLUE CROSS/BLUE SHIELD

## 2023-01-30 ENCOUNTER — Telehealth: Payer: Self-pay

## 2023-01-30 NOTE — Telephone Encounter (Signed)
 In office preop clearance appt scheduled. Pt agrees to appt date and time and also provided with address

## 2023-01-30 NOTE — Telephone Encounter (Signed)
   Pre-operative Risk Assessment    Patient Name: Rachel Brock  DOB: December 16, 1960 MRN: [id number]   Date of last office visit: 04/11/22 Date of next office visit: none   Request for Surgical Clearance    Procedure:  Robotic cholecystectomy   Date of Surgery:  Clearance 02/10/23                                 Surgeon:  Dr. Chandler Lace Surgeon's Group or Practice Name:  [facility]  Phone number:  [phone] Fax number:  [phone]   Type of Clearance Requested:   - Medical    Type of Anesthesia:  not listed    Additional requests/questions:    SignedConnye GORMAN Hoit   01/30/2023, 12:10 PM

## 2023-01-30 NOTE — Telephone Encounter (Signed)
 Primary Cardiologist:Branch, Ronal BRAVO, MD  Chart reviewed as part of pre-operative protocol coverage. Because of Stanislawa A Go's past medical history and time since last visit, he/she will require a follow-up visit in order to better assess preoperative cardiovascular risk.  Pre-op covering staff: - Please schedule appointment and call patient to inform them. She was advised at office visit with Dr. Alvan 04/11/22 to undergo MRI and to follow-up in 6 months.  - Please contact requesting surgeon's office via preferred method (i.e, phone, fax) to inform them of need for appointment prior to surgery.  No request to hold cardiac medications.  Rosaline EMERSON Bane, NP-C  01/30/2023, 12:46 PM [address] Office [phone] Fax [phone]

## 2023-02-01 NOTE — Progress Notes (Deleted)
   Cardiology Office Note    Date:  02/01/2023  ID:  Rachel Brock, DOB December 12, 1960, MRN [id number] PCP:  Buck Search, PA-C  Cardiologist:  Alvan Ronal BRAVO, MD  Electrophysiologist:  None   Chief Complaint: ***  History of Present Illness: .    Rachel Brock is a 63 y.o. female with visit-pertinent history of ***  Labwork independently reviewed:   ROS: .    Please see the history of present illness. Otherwise, review of systems is positive for ***.  All other systems are reviewed and otherwise negative.  Studies Reviewed: [name]    EKG:  EKG is ordered today, personally reviewed, demonstrating ***  CV Studies: Cardiac studies reviewed are outlined and summarized above. Otherwise please see EMR for full report.   Current Reported Medications:.    No outpatient medications have been marked as taking for the 02/03/23 encounter (Appointment) with Amahri Dengel D, NP.    Physical Exam:    VS:  There were no vitals taken for this visit.   Wt Readings from Last 3 Encounters:  04/11/22 188 lb (85.3 kg)  03/16/22 180 lb (81.6 kg)  09/02/21 177 lb 3.2 oz (80.4 kg)    GEN: Well nourished, well developed in no acute distress NECK: No JVD; No carotid bruits CARDIAC: ***RRR, no murmurs, rubs, gallops RESPIRATORY:  Clear to auscultation without rales, wheezing or rhonchi  ABDOMEN: Soft, non-tender, non-distended EXTREMITIES:  No edema; No acute deformity   Asessement and Plan:.     ***     Disposition: F/u with ***  Signed, Leigha Olberding D Eniya Cannady, NP

## 2023-02-03 ENCOUNTER — Ambulatory Visit: Payer: BLUE CROSS/BLUE SHIELD | Admitting: Cardiology

## 2023-03-15 ENCOUNTER — Ambulatory Visit: Payer: BLUE CROSS/BLUE SHIELD | Admitting: Internal Medicine

## 2023-03-30 ENCOUNTER — Ambulatory Visit: Payer: BLUE CROSS/BLUE SHIELD | Admitting: Internal Medicine

## 2023-03-31 ENCOUNTER — Telehealth: Payer: Self-pay

## 2023-03-31 NOTE — Telephone Encounter (Signed)
   Pre-operative Risk Assessment    Patient Name: Rachel Brock  DOB: 1960/10/23 MRN: [id number]   Date of last office visit: Dr. Carolan Clines MD 04/11/2022 Date of next office visit: Reather Littler NP 04/07/2023   Request for Surgical Clearance    Procedure:   Laparoscopic Cholecystectomy   Date of Surgery:  Clearance TBD                                Surgeon: Dr. Charma Igo MD Surgeon's Group or Practice Name: [facility]  Phone number: [phone] Fax number: [phone]   Type of Clearance Requested:   - Medical  - Pharmacy:  Hold Aspirin     Type of Anesthesia:  Not Indicated   Additional requests/questions:    Luellen Pucker   03/31/2023, 4:24 PM

## 2023-03-31 NOTE — Telephone Encounter (Signed)
   Name: ARDINE IACOVELLI  DOB: 18-May-1960  MRN: [id number]  Primary Cardiologist: Maisie Fus, MD  Chart reviewed as part of pre-operative protocol coverage. The patient has an upcoming visit scheduled with  Reather Littler on 04/07/2023 at which time clearance can be addressed in case there are any issues that would impact surgical recommendations.  I added preop FYI to appointment note so that provider is aware to address at time of outpatient visit.  Per office protocol the cardiology provider should forward their finalized clearance decision and recommendations regarding antiplatelet therapy to the requesting party below.    I will route this message as FYI to requesting party and remove this message from the preop box as separate preop APP input not needed at this time.   Please call with any questions.  Napoleon Form, Leodis Rains, NP  03/31/2023, 4:36 PM

## 2023-04-07 ENCOUNTER — Ambulatory Visit: Admitting: Cardiology

## 2023-04-18 NOTE — Progress Notes (Unsigned)
 Cardiology Office Note    Date:  04/20/2023  ID:  Rachel Brock, DOB 05/11/1960, MRN [id number] PCP:  Quita Skye, PA-C  Cardiologist:  Christell Constant, MD  Electrophysiologist:  None   Chief Complaint: Preoperative cardiac evaluation  History of Present Illness: .    Rachel Brock is a 63 y.o. female with visit-pertinent history of HTN, palpitations and HOCM.   Patient was first evaluated by Dr. Wyline Mood on 4/727/23 at the request of her PCP for reports of a heart murmur.  Patient reported some shortness of breath and irregular heart rates.  Reported that when she was walking she could become short of breath that have been ongoing for 3 months.  Nuclear stress test in 05/2021 was normal and low risk.  Echocardiogram in 05/2021 indicated LVEF of 60 to 65%, no RWMA, moderate LVH of the basal septal segment, grade 2 diastolic dysfunction, right ventricular function and size was normal.  There were no significant valvular abnormalities.  Cardiac monitor in 06/2021 indicated predominant underlying rhythm is sinus rhythm, average heart rate of 76 bpm, ranging from 49 to 156 bpm's.  There were 8 triggered events, triggered events were associated with SVT, sinus rhythm and sinus tachycardia.  Repeat cardiac monitor in 04/2022 indicated predominant underlying rhythm was sinus rhythm with an average heart rate of 73 bpm ranging from 47 to 132 bpm.  There were 3 triggered events associated with sinus rhythm and occasional PVCs.  Today she presents for preoperative cardiac evaluation for laparoscopic cholecystectomy.  Today patient reports that she has been doing very well.  She denies any chest pain, reports that her previously noted shortness of breath has significantly improved as well as her palpitations.  She notes occasional lower extremity edema, none recently.  She does remain active and walks in the park, notes that she does have some weakness with prolonged walking, denies any dizziness,  lightheadedness, presyncope or syncope.  ROS: .   Today she denies chest pain, fatigue, palpitations, melena, hematuria, hemoptysis, diaphoresis, weakness, presyncope, syncope, orthopnea, and PND.  All other systems are reviewed and otherwise negative. Studies Reviewed: Marland Kitchen    EKG:  EKG is ordered today, personally reviewed, demonstrating  EKG Interpretation Date/Time:  Thursday April 20 2023 09:10:53 EDT Ventricular Rate:  65 PR Interval:  160 QRS Duration:  76 QT Interval:  368 QTC Calculation: 382 R Axis:   65  Text Interpretation: Normal sinus rhythm When compared with ECG of 16-Mar-2022 17:07, No significant change was found Confirmed by Reather Littler [id number]) on 04/20/2023 9:51:00 AM   CV Studies: Cardiac studies reviewed are outlined and summarized above. Otherwise please see EMR for full report. Cardiac Studies & Procedures   ______________________________________________________________________________________________   STRESS TESTS  MYOCARDIAL PERFUSION IMAGING 06/01/2021  Narrative   The study is normal. The study is low risk.   No ST deviation was noted.   LV perfusion is normal. There is no evidence of ischemia. There is no evidence of infarction.   Left ventricular function is normal. Nuclear stress EF: 87 %. The left ventricular ejection fraction is hyperdynamic (>65%). End diastolic cavity size is normal. End systolic cavity size is normal.   Prior study not available for comparison.   ECHOCARDIOGRAM  ECHOCARDIOGRAM COMPLETE 06/14/2021  Narrative ECHOCARDIOGRAM REPORT    [name] Date of Exam: 06/14/2021 Medical Rec #:  [id number]      Height:       61.0 in Accession #:  [id number]     Weight:       182.0 lb Date of Birth:  [birth year]      BSA:          1.815 m Patient Age:    60 years       BP:           99/63 mmHg Patient Gender: F              HR:           56 bpm. Exam Location:  [address]  Procedure: 2D Echo, Cardiac Doppler and  Color Doppler  Indications:    R06.00 SOB  History:        Patient has no prior history of Echocardiogram examinations. Signs/Symptoms:Shortness of Breath, Murmur and Palpitations; Risk Factors:Former Smoker.  Sonographer:    Samule Ohm RDCS Referring Phys: [phone] MARY E BRANCH  IMPRESSIONS   1. Moderate hypertrophy of the basal septum with otherwise mild concentric LVH. Left ventricular ejection fraction, by estimation, is 60 to 65%. The left ventricle has normal function. The left ventricle has no regional wall motion abnormalities. There is moderate left ventricular hypertrophy of the basal-septal segment. Left ventricular diastolic parameters are consistent with Grade II diastolic dysfunction (pseudonormalization). Elevated left ventricular end-diastolic pressure. 2. Right ventricular systolic function is normal. The right ventricular size is normal. 3. The mitral valve is normal in structure. No evidence of mitral valve regurgitation. No evidence of mitral stenosis. 4. The aortic valve is tricuspid. Aortic valve regurgitation is not visualized. No aortic stenosis is present. 5. The inferior vena cava is normal in size with greater than 50% respiratory variability, suggesting right atrial pressure of 3 mmHg.  FINDINGS Left Ventricle: Moderate hypertrophy of the basal septum with otherwise mild concentric LVH. Left ventricular ejection fraction, by estimation, is 60 to 65%. The left ventricle has normal function. The left ventricle has no regional wall motion abnormalities. The left ventricular internal cavity size was normal in size. There is moderate left ventricular hypertrophy of the basal-septal segment. Left ventricular diastolic parameters are consistent with Grade II diastolic dysfunction (pseudonormalization). Elevated left ventricular end-diastolic pressure.  Right Ventricle: The right ventricular size is normal. No increase in right ventricular wall thickness. Right  ventricular systolic function is normal.  Left Atrium: Left atrial size was normal in size.  Right Atrium: Right atrial size was normal in size.  Pericardium: There is no evidence of pericardial effusion.  Mitral Valve: The mitral valve is normal in structure. No evidence of mitral valve regurgitation. No evidence of mitral valve stenosis.  Tricuspid Valve: The tricuspid valve is normal in structure. Tricuspid valve regurgitation is not demonstrated. No evidence of tricuspid stenosis.  Aortic Valve: The aortic valve is tricuspid. Aortic valve regurgitation is not visualized. No aortic stenosis is present. Aortic valve mean gradient measures 12.7 mmHg. Aortic valve peak gradient measures 26.1 mmHg. Aortic valve area, by VTI measures 1.05 cm.  Pulmonic Valve: The pulmonic valve was normal in structure. Pulmonic valve regurgitation is mild. No evidence of pulmonic stenosis.  Aorta: The aortic root is normal in size and structure.  Venous: The inferior vena cava was not well visualized. The inferior vena cava is normal in size with greater than 50% respiratory variability, suggesting right atrial pressure of 3 mmHg.  IAS/Shunts: No atrial level shunt detected by color flow Doppler.   LEFT VENTRICLE PLAX 2D LVIDd:         3.30 cm   Diastology LVIDs:  1.80 cm   LV e' medial:    6.31 cm/s LV PW:         1.10 cm   LV E/e' medial:  17.0 LV IVS:        1.40 cm   LV e' lateral:   7.51 cm/s LVOT diam:     1.80 cm   LV E/e' lateral: 14.2 LV SV:         59 LV SV Index:   32 LVOT Area:     2.54 cm   RIGHT VENTRICLE RV S prime:     16.60 cm/s TAPSE (M-mode): 2.2 cm  LEFT ATRIUM             Index        RIGHT ATRIUM           Index LA diam:        3.70 cm 2.04 cm/m   RA Pressure: 3.00 mmHg LA Vol (A2C):   31.0 ml 17.08 ml/m  RA Area:     8.60 cm LA Vol (A4C):   50.2 ml 27.67 ml/m  RA Volume:   16.20 ml  8.93 ml/m LA Biplane Vol: 41.9 ml 23.09 ml/m AORTIC VALVE AV Area  (Vmax):    0.92 cm AV Area (Vmean):   1.00 cm AV Area (VTI):     1.05 cm AV Vmax:           255.67 cm/s AV Vmean:          163.667 cm/s AV VTI:            0.559 m AV Peak Grad:      26.1 mmHg AV Mean Grad:      12.7 mmHg LVOT Vmax:         92.60 cm/s LVOT Vmean:        64.200 cm/s LVOT VTI:          0.230 m LVOT/AV VTI ratio: 0.41  AORTA Ao Root diam: 2.60 cm Ao Asc diam:  3.10 cm  MITRAL VALVE                TRICUSPID VALVE MV Area (PHT): 3.33 cm     Estimated RAP:  3.00 mmHg MV Decel Time: 228 msec MV E velocity: 107.00 cm/s  SHUNTS MV A velocity: 100.00 cm/s  Systemic VTI:  0.23 m MV E/A ratio:  1.07         Systemic Diam: 1.80 cm  Chilton Si MD Electronically signed by Chilton Si MD Signature Date/Time: 06/15/2021/12:48:21 PM    Final    MONITORS  LONG TERM MONITOR (3-14 DAYS) 05/19/2022  Narrative 3 triggered events for sinus rhythm, occasional PVCs  Patch Wear Time:  13 days and 23 hours (2024-04-08T11:47:02-398 to 2024-04-22T11:07:00-0400)  Patient had a min HR of 47 bpm, max HR of 132 bpm, and avg HR of 73 bpm. Predominant underlying rhythm was Sinus Rhythm. QRS morphology changes were present throughout recording. Isolated SVEs were rare (<1.0%), SVE Couplets were rare (<1.0%), and no SVE Triplets were present. Isolated VEs were occasional (2.3%, [id number]), and no VE Couplets or VE Triplets were present. Ventricular Bigeminy and Trigeminy were present.       ______________________________________________________________________________________________       Current Reported Medications:.    Current Meds  Medication Sig   albuterol (VENTOLIN HFA) 108 (90 Base) MCG/ACT inhaler Inhale into the lungs.   amLODipine (NORVASC) 10 MG tablet Take 10 mg by mouth daily.   aspirin EC 81  MG tablet Take 81 mg by mouth daily.   azithromycin (ZITHROMAX) 250 MG tablet Take 1 tablet (250 mg total) by mouth daily. Take first 2 tablets together, then 1 every  day until finished.   benzonatate (TESSALON) 100 MG capsule Take 1 capsule (100 mg total) by mouth every 8 (eight) hours.   cetirizine (ZYRTEC) 10 MG tablet Take 1 tablet by mouth daily.   chlorpheniramine-HYDROcodone (TUSSIONEX) 10-8 MG/5ML Take 5 mLs by mouth every 12 (twelve) hours as needed for cough.   Cholecalciferol 50 MCG (2000 UT) CAPS Take 1 capsule by mouth daily.   diltiazem (CARDIZEM) 30 MG tablet Take 1 tablet (30 mg total) by mouth every 6 (six) hours as needed (palpitations).   ezetimibe (ZETIA) 10 MG tablet Take 10 mg by mouth daily.   famotidine (PEPCID) 20 MG tablet Take 1 tablet (20 mg total) by mouth 2 (two) times daily.   fluticasone (FLONASE) 50 MCG/ACT nasal spray Place into the nose.   furosemide (LASIX) 20 MG tablet Take 1 tablet (20 mg total) by mouth daily.   hydrochlorothiazide (HYDRODIURIL) 25 MG tablet Take 0.5 tablets (12.5 mg total) by mouth daily.   losartan (COZAAR) 100 MG tablet Take 100 mg by mouth daily.   metFORMIN (GLUCOPHAGE) 500 MG tablet Take 500 mg by mouth 2 (two) times daily with a meal.   metoprolol succinate (TOPROL XL) 50 MG 24 hr tablet Take 1 tablet (50 mg total) by mouth daily. Take with or immediately following a meal.   nitrofurantoin, macrocrystal-monohydrate, (MACROBID) 100 MG capsule Take 1 capsule (100 mg total) by mouth 2 (two) times daily.   omeprazole (PRILOSEC) 20 MG capsule Take by mouth.   ondansetron (ZOFRAN ODT) 4 MG disintegrating tablet Take 1 tablet (4 mg total) by mouth every 8 (eight) hours as needed for nausea or vomiting.   oxybutynin (DITROPAN-XL) 10 MG 24 hr tablet Take 10 mg by mouth daily.   phenazopyridine (PYRIDIUM) 95 MG tablet Take 1 tablet (95 mg total) by mouth 3 (three) times daily as needed for pain.   Semaglutide 14 MG TABS Take by mouth.   traMADol (ULTRAM) 50 MG tablet Take 1 tablet (50 mg total) by mouth every 6 (six) hours as needed.   triamcinolone lotion (KENALOG) 0.1 % Apply topically.    Physical  Exam:    VS:  BP 130/60 (BP Location: Left Arm, Patient Position: Sitting, Cuff Size: Normal)   Pulse 65   Ht 5\' 1"  (1.549 m)   Wt 175 lb (79.4 kg)   SpO2 97%   BMI 33.07 kg/m    Wt Readings from Last 3 Encounters:  04/20/23 175 lb (79.4 kg)  04/11/22 188 lb (85.3 kg)  03/16/22 180 lb (81.6 kg)    GEN: Well nourished, well developed in no acute distress NECK: No JVD; No carotid bruits CARDIAC: SEM worse with valsalva, RRR, no rubs or gallops RESPIRATORY:  Clear to auscultation without rales, wheezing or rhonchi  ABDOMEN: Soft, non-tender, non-distended EXTREMITIES:  No edema; No acute deformity     Asessement and Plan:.    Preoperative cardiac evaluation: Laparoscopic cholecystectomy with Dr. Alonna Minium, date TBD. Ms. Radcliffe perioperative risk of a major cardiac event is 0.4% according to the Revised Cardiac Risk Index (RCRI), patient denies any new symptoms today, notes improvement.  Previously discussed with Dr. Wyline Mood who noted that if patient was asymptomatic regarding HOCM she could proceed without the MRI however on discussion with patient she has elected to undergo cardiac MRI prior  to her planned procedure.  Preoperative cardiac evaluation will be completed and recommendations regarding procedure will be completed after cardiac MRI on follow-up.  Moderate LVH/HOCM: Echocardiogram in 05/2021 indicated LVEF of 60 to 65%, no RWMA, moderate LVH of the basal septal segment, grade 2 diastolic dysfunction, right ventricular function and size was normal.  There were no significant valvular abnormalities.  Per Dr. 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  Patient had previously agreed to cardiac MRI last year, unfortunately was not completed.  Today patient is agreeable to cardiac MRI.  Patient denies any syncopal or presyncopal events, denies any family history of SCD.  Patient  reports that her palpitations have significantly improved.  Discussed importance of staying well-hydrated.  Patient to have cardiac MRI.  Palpitations/SVT: Cardiac monitor in 06/2021 indicated predominant underlying rhythm is sinus rhythm, average heart rate of 76 bpm, ranging from 49 to 156 bpm's.  There were 8 triggered events, triggered events were associated with SVT, sinus rhythm and sinus tachycardia. Repeat cardiac monitor in 04/2022 indicated predominant underlying rhythm was sinus rhythm with an average heart rate of 73 bpm ranging from 47 to 132 bpm.  There were 3 triggered events associated with sinus rhythm and occasional PVCs.  Patient has been maintained on metoprolol succinate 50 mg daily, Cardizem as needed.  Today patient reports that her palpitations have significantly improved, are infrequent and nonbothersome.  Patient reports she has not used Cardizem, given she is on amlodipine will discontinue. Will continue metoprolol succinate 50 mg daily.  Hypertension: Blood pressure today 130/60.  Continue current regimen. Stressed the importance of staying well hydrated as she is on hydrochlorothiazide. Continue current antihypertensive regimen, managed by her PCP.     Disposition: F/u with Reather Littler, NP or Dr. Izora Ribas following cardiac MRI.   Signed, Rip Harbour, NP

## 2023-04-20 ENCOUNTER — Ambulatory Visit: Attending: Cardiology | Admitting: Cardiology

## 2023-04-20 ENCOUNTER — Encounter: Payer: Self-pay | Admitting: Cardiology

## 2023-04-20 VITALS — BP 130/60 | HR 65 | Ht 61.0 in | Wt 175.0 lb

## 2023-04-20 DIAGNOSIS — I517 Cardiomegaly: Secondary | ICD-10-CM | POA: Diagnosis not present

## 2023-04-20 DIAGNOSIS — Z01810 Encounter for preprocedural cardiovascular examination: Secondary | ICD-10-CM | POA: Diagnosis not present

## 2023-04-20 DIAGNOSIS — I421 Obstructive hypertrophic cardiomyopathy: Secondary | ICD-10-CM | POA: Diagnosis not present

## 2023-04-20 DIAGNOSIS — R002 Palpitations: Secondary | ICD-10-CM

## 2023-04-20 DIAGNOSIS — R011 Cardiac murmur, unspecified: Secondary | ICD-10-CM

## 2023-04-20 LAB — CBC

## 2023-04-20 NOTE — Patient Instructions (Addendum)
 Medication Instructions:  No changes *If you need a refill on your cardiac medications before your next appointment, please call your pharmacy*  Lab Work: Today we are going to draw Bmet and CBC If you have labs (blood work) drawn today and your tests are completely normal, you will receive your results only by: MyChart Message (if you have MyChart) OR A paper copy in the mail If you have any lab test that is abnormal or we need to change your treatment, we will call you to review the results.  Testing/Procedures: Your physician has requested that you have a cardiac MRI. Cardiac MRI uses a computer to create images of your heart as its beating, producing both still and moving pictures of your heart and major blood vessels. For further information please visit [email or web address]. Please follow the instruction sheet given to you today for more information.  Follow-Up: At [facility], you and your health needs are our priority.  As part of our continuing mission to provide you with exceptional heart care, we have created designated Provider Care Teams.  These Care Teams include your primary Cardiologist (physician) and Advanced Practice Providers (APPs -  Physician Assistants and Nurse Practitioners) who all work together to provide you with the care you need, when you need it.  We recommend signing up for the patient portal called "MyChart".  Sign up information is provided on this After Visit Summary.  MyChart is used to connect with patients for Virtual Visits (Telemedicine).  Patients are able to view lab/test results, encounter notes, upcoming appointments, etc.  Non-urgent messages can be sent to your provider as well.   To learn more about what you can do with MyChart, go to [email or web address].    Your next appointment:   F/u after MRI with Reather Littler NP 4 month(s) with Christell Constant, MD     Other Instructions

## 2023-04-21 ENCOUNTER — Encounter: Payer: Self-pay | Admitting: Cardiology

## 2023-04-21 LAB — BASIC METABOLIC PANEL WITH GFR
BUN/Creatinine Ratio: 13 (ref 12–28)
BUN: 13 mg/dL (ref 8–27)
CO2: 24 mmol/L (ref 20–29)
Calcium: 11 mg/dL — ABNORMAL HIGH (ref 8.7–10.3)
Chloride: 101 mmol/L (ref 96–106)
Creatinine, Ser: 0.97 mg/dL (ref 0.57–1.00)
Glucose: 171 mg/dL — ABNORMAL HIGH (ref 70–99)
Potassium: 4.4 mmol/L (ref 3.5–5.2)
Sodium: 140 mmol/L (ref 134–144)
eGFR: 67 mL/min/{1.73_m2} (ref >59)

## 2023-04-21 LAB — CBC
Hematocrit: 40.7 % (ref 34.0–46.6)
Hemoglobin: 13.7 g/dL (ref 11.1–15.9)
MCH: 29.3 pg (ref 26.6–33.0)
MCHC: 33.7 g/dL (ref 31.5–35.7)
MCV: 87 fL (ref 79–97)
Platelets: 364 10*3/uL (ref 150–450)
RBC: 4.67 x10E6/uL (ref 3.77–5.28)
RDW: 13.8 % (ref 11.7–15.4)
WBC: 6.4 10*3/uL (ref 3.4–10.8)

## 2023-04-28 ENCOUNTER — Encounter (HOSPITAL_BASED_OUTPATIENT_CLINIC_OR_DEPARTMENT_OTHER): Payer: Self-pay

## 2023-06-12 ENCOUNTER — Ambulatory Visit (HOSPITAL_COMMUNITY): Admission: RE | Admit: 2023-06-12 | Source: Ambulatory Visit

## 2023-08-04 ENCOUNTER — Telehealth (HOSPITAL_COMMUNITY): Payer: Self-pay | Admitting: *Deleted

## 2023-08-04 NOTE — Telephone Encounter (Signed)
 Reaching out to patient to offer assistance regarding upcoming cardiac imaging study; pt verbalizes understanding of appt date/time, parking situation and where to check in, pre-test NPO status and medications ordered, and verified current allergies; name and call back number provided for further questions should they arise Rachel Frame RN Navigator Cardiac Imaging [facility] [phone] office [phone] cell  Patient denies metal or claustrophobia.

## 2023-08-07 ENCOUNTER — Other Ambulatory Visit: Payer: Self-pay | Admitting: Cardiology

## 2023-08-07 ENCOUNTER — Ambulatory Visit (HOSPITAL_COMMUNITY)
Admission: RE | Admit: 2023-08-07 | Discharge: 2023-08-07 | Disposition: A | Discharge status: Home / Self Care | Source: Ambulatory Visit | Attending: Internal Medicine | Admitting: Internal Medicine

## 2023-08-07 DIAGNOSIS — I517 Cardiomegaly: Secondary | ICD-10-CM

## 2023-08-07 MED ORDER — GADOBUTROL 1 MMOL/ML IV SOLN
10.0000 mL | Freq: Once | INTRAVENOUS | Status: AC | PRN
  Administered 2023-08-07: 10 mL via INTRAVENOUS

## 2023-08-08 ENCOUNTER — Ambulatory Visit: Payer: Self-pay | Admitting: Cardiology

## 2023-08-08 DIAGNOSIS — R011 Cardiac murmur, unspecified: Secondary | ICD-10-CM

## 2023-08-08 DIAGNOSIS — I350 Nonrheumatic aortic (valve) stenosis: Secondary | ICD-10-CM

## 2023-08-09 NOTE — Telephone Encounter (Signed)
 Call to patient to discuss results of cardiac MRI, no DPR on file. Left message with no identifiers asking recipient to call [place] at our office #. Also sent MC. Echo ordered.

## 2023-08-09 NOTE — Telephone Encounter (Signed)
-----   Message from Wilbert Bihari sent at 08/08/2023  9:11 AM EDT ----- Cardiac MRI showed normal heart function EF 62% with some thickening of the heart muscle likely related to hypertensive heart disease.  The RV was normal.  There does appear some restriction of the  aortic valve leaflets and possible aortic stenosis.  Please get a 2D echo to assess for stenosis.  No evidence on cardiac MRI of HOCM ----- Message ----- From: Interface, Rad Results In Sent: 08/08/2023   8:11 AM EDT To: Lurena Hershal [place] Prov[facility]

## 2023-08-10 ENCOUNTER — Ambulatory Visit: Payer: Self-pay | Admitting: Cardiology

## 2023-08-15 ENCOUNTER — Telehealth: Payer: Self-pay

## 2023-08-15 NOTE — Telephone Encounter (Signed)
   Primary Cardiologist: Stanly DELENA Leavens, MD  Chart reviewed as part of pre-operative protocol coverage. Dental extractions are considered low risk procedures per guidelines and generally do not require any specific cardiac clearance. She recently underwent cardiac MR for evaluation of hypertrophic cardiomyopathy. She has normal heart function and is awaiting echo for evaluation of aortic valve. She may proceed with dental extractions prior to echo.   Aspirin is not managed by cardiology.   SBE prophylaxis is not required for the patient.  I will route this recommendation to the requesting party via Epic fax function and remove from pre-op pool.  Please call with questions.  Rosaline EMERSON Bane, NP-C  08/15/2023, 11:47 AM [address] [place], [place] [address] Office [phone] Fax [phone]

## 2023-08-15 NOTE — Telephone Encounter (Signed)
   Pre-operative Risk Assessment    Patient Name: Rachel Brock  DOB: 29-Mar-1960 MRN: [id number]   Date of last office visit: 04/20/23 Date of next office visit: 08/18/23  Request for Surgical Clearance    Procedure:  Dental Extraction - Amount of Teeth to be Pulled:  6 simple extractions of lower teeth   Date of Surgery:  Clearance 08/24/23                                 Surgeon:  Krystal MORTON Celena, MD  Surgeon's Group or Practice Name:  [facility], [place]  Phone number:  [phone] Fax number:  [phone]   Type of Clearance Requested:   - Medical  - Pharmacy:  Hold Aspirin Not indicated    Type of Anesthesia:  Local w/epi and IV sedation    Additional requests/questions:    Bonney Rebeca Blight   08/15/2023, 11:29 AM

## 2023-08-17 NOTE — Telephone Encounter (Signed)
-----   Message from Wilbert Bihari sent at 08/08/2023  9:11 AM EDT ----- Cardiac MRI showed normal heart function EF 62% with some thickening of the heart muscle likely related to hypertensive heart disease.  The RV was normal.  There does appear some restriction of the  aortic valve leaflets and possible aortic stenosis.  Please get a 2D echo to assess for stenosis.  No evidence on cardiac MRI of HOCM ----- Message ----- From: Interface, Rad Results In Sent: 08/08/2023   8:11 AM EDT To: Lurena Hershal [place] Prov[facility]

## 2023-08-17 NOTE — Telephone Encounter (Signed)
 Call to patient to advise Dr. Shlomo says that Cardiac MRI showed normal heart function EF 62% with some thickening of the heart muscle likely related to hypertensive heart disease. The right lower chamber of the heart was normal. There does appear to be some restriction of the aortic valve leaflets and possible aortic stenosis. The good news here is that there was no evidence on cardiac MRI of HOCM (a type of cardiomyopathy). Dr. Shlomo advises echo, patient agrees and already has it scheduled.

## 2023-08-17 NOTE — Telephone Encounter (Signed)
 Called patient advised of below they verbalized understanding.

## 2023-08-17 NOTE — Telephone Encounter (Signed)
-----   Message from Katlyn D West sent at 08/10/2023  5:58 PM EDT ----- Please let Rachel Brock know that her cardiac MRI showed normal heart squeeze and function, there was mild thickening of the left lower chamber of the heart, findings not consistent with hypertrophic  cardiomyopathy and is likely more consistent with thickening caused by hypertension.  Imaging did indicate possible aortic valve narrowing, recommended to check echocardiogram.  If patient is  agreeable please order echocardiogram for aortic valve stenosis. Overall good results, follow-up with Dr. Santo as planned.  ----- Message ----- From: Interface, Rad Results In Sent: 08/08/2023   8:11 AM EDT To: Katlyn D West, NP

## 2023-08-18 ENCOUNTER — Encounter: Payer: Self-pay | Admitting: Internal Medicine

## 2023-08-18 ENCOUNTER — Ambulatory Visit: Attending: Internal Medicine | Admitting: Internal Medicine

## 2023-08-18 VITALS — BP 148/83 | HR 60 | Ht 61.0 in | Wt 188.0 lb

## 2023-08-18 DIAGNOSIS — R011 Cardiac murmur, unspecified: Secondary | ICD-10-CM | POA: Insufficient documentation

## 2023-08-18 DIAGNOSIS — I517 Cardiomegaly: Secondary | ICD-10-CM | POA: Diagnosis present

## 2023-08-18 DIAGNOSIS — I350 Nonrheumatic aortic (valve) stenosis: Secondary | ICD-10-CM | POA: Insufficient documentation

## 2023-08-18 DIAGNOSIS — Z01810 Encounter for preprocedural cardiovascular examination: Secondary | ICD-10-CM | POA: Insufficient documentation

## 2023-08-18 DIAGNOSIS — R002 Palpitations: Secondary | ICD-10-CM | POA: Diagnosis present

## 2023-08-18 MED ORDER — DILTIAZEM HCL 30 MG PO TABS: 30.0000 mg | ORAL_TABLET | Freq: Four times a day (QID) | ORAL | 5 refills | Status: AC | PRN

## 2023-08-18 NOTE — Progress Notes (Signed)
 Cardiology Office Note:  .    Date:  08/18/2023  ID:  Rachel Brock, DOB 1960/08/16, MRN [id number] PCP: Buck Search, PA-C  [facility] Providers Cardiologist:  Stanly DELENA Leavens, MD     CC: Palpitations and Exertional SOB  History of Present Illness: .    Rachel Brock is a 63 y.o. female  with a history of hypertension, diabetes, and chronic kidney disease who presents with a heart murmur and possible hypertrophic cardiomyopathy. She was referred by Dr. Ronal Ross for evaluation of a heart murmur and possible hypertrophic cardiomyopathy.  She has a history of hypertension, diabetes, and chronic kidney disease. Previously, she was evaluated for a heart murmur and diagnosed with mild aortic stenosis. Initial concerns about hypertrophic cardiomyopathy were ruled out by an MRI.  She experiences 'chest vibrations' and an increased heart rate, especially during physical exertion like lifting heavy objects or walking into her house, which requires her to sit down to lower her heart rate. She also describes exertional symptoms such as feeling her heart 'coming out of her chest' when walking longer distances and experiencing shortness of breath, particularly when walking out of the park.  She has a history of supraventricular tachycardia and is prescribed diltiazem , which she uses as needed but currently requires a refill. Her blood pressure is generally well-controlled, ranging from 120/60 to 130/60, and she is on amlodipine for hypertension.  She takes aspirin, prescribed by her primary care provider, though she is unsure of the specific reason, recalling it might be related to sun exposure or bone density.  In her family history, her mother had multiple strokes, and her father had heart issues requiring open-heart surgery for blockages in his sixties or seventies. There is no family history of hypertrophic cardiomyopathy or sudden cardiac death.  She smoked many years ago but  has not had asthma. She underwent a special type of stress test in 2023, which did not show evidence of a blockage.  Discussed the use of AI scribe software for clinical note transcription with the patient, who gave verbal consent to proceed.   Relevant histories: .  Social  - Former MB patient - no hx of HCM or SCD in family; father had MI and CABG in 79s - Tobacco: Former smoker (quit 40 years ago) - Employment: Veterinary surgeon (degree in counseling) ROS: As per HPI.   Studies Reviewed: .     Cardiac Studies & Procedures   ______________________________________________________________________________________________   STRESS TESTS  MYOCARDIAL PERFUSION IMAGING 06/01/2021  Interpretation Summary   The study is normal. The study is low risk.   No ST deviation was noted.   LV perfusion is normal. There is no evidence of ischemia. There is no evidence of infarction.   Left ventricular function is normal. Nuclear stress EF: 87 %. The left ventricular ejection fraction is hyperdynamic (>65%). End diastolic cavity size is normal. End systolic cavity size is normal.   Prior study not available for comparison.   ECHOCARDIOGRAM  ECHOCARDIOGRAM COMPLETE 06/14/2021  Narrative ECHOCARDIOGRAM REPORT    [name] Date of Exam: 06/14/2021 Medical Rec #:  [id number]      Height:       61.0 in Accession #:    [id number]     Weight:       182.0 lb Date of Birth:  Apr 27, 1960      BSA:          1.815 m Patient Age:  60 years       BP:           99/63 mmHg Patient Gender: F              HR:           56 bpm. Exam Location:  [address]  Procedure: 2D Echo, Cardiac Doppler and Color Doppler  Indications:    R06.00 SOB  History:        Patient has no prior history of Echocardiogram examinations. Signs/Symptoms:Shortness of Breath, Murmur and Palpitations; Risk Factors:Former Smoker.  Sonographer:    Elsie Bohr RDCS Referring Phys: [id number] MARY E  BRANCH  IMPRESSIONS   1. Moderate hypertrophy of the basal septum with otherwise mild concentric LVH. Left ventricular ejection fraction, by estimation, is 60 to 65%. The left ventricle has normal function. The left ventricle has no regional wall motion abnormalities. There is moderate left ventricular hypertrophy of the basal-septal segment. Left ventricular diastolic parameters are consistent with Grade II diastolic dysfunction (pseudonormalization). Elevated left ventricular end-diastolic pressure. 2. Right ventricular systolic function is normal. The right ventricular size is normal. 3. The mitral valve is normal in structure. No evidence of mitral valve regurgitation. No evidence of mitral stenosis. 4. The aortic valve is tricuspid. Aortic valve regurgitation is not visualized. No aortic stenosis is present. 5. The inferior vena cava is normal in size with greater than 50% respiratory variability, suggesting right atrial pressure of 3 mmHg.  FINDINGS Left Ventricle: Moderate hypertrophy of the basal septum with otherwise mild concentric LVH. Left ventricular ejection fraction, by estimation, is 60 to 65%. The left ventricle has normal function. The left ventricle has no regional wall motion abnormalities. The left ventricular internal cavity size was normal in size. There is moderate left ventricular hypertrophy of the basal-septal segment. Left ventricular diastolic parameters are consistent with Grade II diastolic dysfunction (pseudonormalization). Elevated left ventricular end-diastolic pressure.  Right Ventricle: The right ventricular size is normal. No increase in right ventricular wall thickness. Right ventricular systolic function is normal.  Left Atrium: Left atrial size was normal in size.  Right Atrium: Right atrial size was normal in size.  Pericardium: There is no evidence of pericardial effusion.  Mitral Valve: The mitral valve is normal in structure. No evidence of mitral  valve regurgitation. No evidence of mitral valve stenosis.  Tricuspid Valve: The tricuspid valve is normal in structure. Tricuspid valve regurgitation is not demonstrated. No evidence of tricuspid stenosis.  Aortic Valve: The aortic valve is tricuspid. Aortic valve regurgitation is not visualized. No aortic stenosis is present. Aortic valve mean gradient measures 12.7 mmHg. Aortic valve peak gradient measures 26.1 mmHg. Aortic valve area, by VTI measures 1.05 cm.  Pulmonic Valve: The pulmonic valve was normal in structure. Pulmonic valve regurgitation is mild. No evidence of pulmonic stenosis.  Aorta: The aortic root is normal in size and structure.  Venous: The inferior vena cava was not well visualized. The inferior vena cava is normal in size with greater than 50% respiratory variability, suggesting right atrial pressure of 3 mmHg.  IAS/Shunts: No atrial level shunt detected by color flow Doppler.   LEFT VENTRICLE PLAX 2D LVIDd:         3.30 cm   Diastology LVIDs:         1.80 cm   LV e' medial:    6.31 cm/s LV PW:         1.10 cm   LV E/e' medial:  17.0 LV IVS:  1.40 cm   LV e' lateral:   7.51 cm/s LVOT diam:     1.80 cm   LV E/e' lateral: 14.2 LV SV:         59 LV SV Index:   32 LVOT Area:     2.54 cm   RIGHT VENTRICLE RV S prime:     16.60 cm/s TAPSE (M-mode): 2.2 cm  LEFT ATRIUM             Index        RIGHT ATRIUM           Index LA diam:        3.70 cm 2.04 cm/m   RA Pressure: 3.00 mmHg LA Vol (A2C):   31.0 ml 17.08 ml/m  RA Area:     8.60 cm LA Vol (A4C):   50.2 ml 27.67 ml/m  RA Volume:   16.20 ml  8.93 ml/m LA Biplane Vol: 41.9 ml 23.09 ml/m AORTIC VALVE AV Area (Vmax):    0.92 cm AV Area (Vmean):   1.00 cm AV Area (VTI):     1.05 cm AV Vmax:           255.67 cm/s AV Vmean:          163.667 cm/s AV VTI:            0.559 m AV Peak Grad:      26.1 mmHg AV Mean Grad:      12.7 mmHg LVOT Vmax:         92.60 cm/s LVOT Vmean:        64.200  cm/s LVOT VTI:          0.230 m LVOT/AV VTI ratio: 0.41  AORTA Ao Root diam: 2.60 cm Ao Asc diam:  3.10 cm  MITRAL VALVE                TRICUSPID VALVE MV Area (PHT): 3.33 cm     Estimated RAP:  3.00 mmHg MV Decel Time: 228 msec MV E velocity: 107.00 cm/s  SHUNTS MV A velocity: 100.00 cm/s  Systemic VTI:  0.23 m MV E/A ratio:  1.07         Systemic Diam: 1.80 cm  Annabella Scarce MD Electronically signed by Annabella Scarce MD Signature Date/Time: 06/15/2021/12:48:21 PM    Final    MONITORS  LONG TERM MONITOR (3-14 DAYS) 05/19/2022  Narrative 3 triggered events for sinus rhythm, occasional PVCs  Patch Wear Time:  13 days and 23 hours (2024-04-08T11:47:02-398 to 2024-04-22T11:07:00-0400)  Patient had a min HR of 47 bpm, max HR of 132 bpm, and avg HR of 73 bpm. Predominant underlying rhythm was Sinus Rhythm. QRS morphology changes were present throughout recording. Isolated SVEs were rare (<1.0%), SVE Couplets were rare (<1.0%), and no SVE Triplets were present. Isolated VEs were occasional (2.3%, [id number]), and no VE Couplets or VE Triplets were present. Ventricular Bigeminy and Trigeminy were present.     CARDIAC MRI  MR CARDIAC MORPHOLOGY W WO CONTRAST 08/07/2023  Narrative CLINICAL DATA:  Cardiomyopathy suspected LVH  COMPARISON: Echo 06/14/21  EXAM: MR CARDIA MORPHOLOGY WITHOUT AND WITH CONTRAST; MR CARDIAC VELOCITY FLOW MAPPING  TECHNIQUE: The patient was scanned on a 1.5 Tesla Siemens magnet. A dedicated cardiac coil was used. Functional imaging was done using TrueFisp sequences. 2,3, and 4 chamber views were done to assess for RWMA's. Modified Simpson's rule using a short axis stack was used to calculate an ejection fraction on a dedicated work station  using [employer]. The patient received 10mL GADAVIST  GADOBUTROL  1 MMOL/ML IV SOLN. After 10 minutes inversion recovery sequences were used to assess for infiltration and scar tissue. Phase  contrast velocity encoded images obtained x 2.  This examination is tailored for evaluation cardiac anatomy and function and provides very limited assessment of noncardiac structures, which are accordingly not evaluated during interpretation. If there is clinical concern for extracardiac pathology, further evaluation with CT imaging should be considered.  FINDINGS: LEFT VENTRICLE:  Left ventricular chamber size: Normal.  Left ventricular wall thickness: Mildly increased. Maximal wall thickness 13 mm in the basal-mid inferoseptum wall.  There is no systolic anterior motion of the mitral valve. No flow dephasing to suggest left ventricular outflow tract obstruction. Mitral regurgitation is not visually seen.  No evidence of left ventricular apical aneurysm.  Findings are not consistent with hypertrophic cardiomyopathy with obstruction.  Left ventricular systolic function: Normal  LVEF = 62%  There are no regional wall motion abnormalities.  No myocardial edema, T2 = 47 msec  Normal first pass perfusion.  There is post contrast delayed myocardial enhancement: Faint inferior RV insertion point LGE, may indicated increased pulmonary pressures. This is otherwise nonspecific. No definite LGE seen in area of maximal wall thickness.  Normal T1 myocardial nulling kinetics suggest against a diagnosis of cardiac amyloidosis.  ECV = 25%, using HCT 41%  RIGHT VENTRICLE:  Normal right ventricular chamber size.  Normal right ventricular wall thickness.  Normal right ventricular systolic function.  RVEF = 51%, visually appears higher.  There are no regional wall motion abnormalities.  No post contrast delayed myocardial enhancement.  ATRIA:  Grossly normal  PERICARDIUM:  Normal pericardium.  Trivial pericardial effusion.  OTHER: No significant extracardiac findings.  MEASUREMENTS: Qp/Qs: 1.03  VALVES:  Aortic valve: Abnormal - appears to have moderately  restricted motion and turbulence/flow acceleration through valve. Consider echocardiogram to assess severity of aortic valve stenosis which was mild on echo 06/14/21. VENC peak set at 200, therefore quantitative assessment AS not evaluated on this exam.  Aortic valve regurgitation: Trivial, regurgitant fraction 5%  Pulmonary valve regurgitation: Trace, regurgitant fraction 1%  Mitral valve regurgitation: Mild, regurgitant fraction 17%  Tricuspid valve regurgitation: Mild, regurgitant fraction 13%  Left ventricle:  LV female  LV EF: 62 % (Normal 52-79%)  Absolute volumes:  LV EDV: 78mL (Normal 78-167 mL)  LV ESV: 29mL (Normal 21-64 mL)  LV SV: 48mL (Normal 52-114 mL)  CO: 2.8L/min (Normal 2.7-6.3 L/min)  Indexed volumes:  CI: 1.5L/min/sq-m (Normal 1.9-3.9 L/min/sq-m)  LV EDV: 33mL/sq-m (Normal 50-96 mL/sq-m)  LV ESV: 81mL/sq-m (Normal 10-40 mL/sq-m)  LV SV: 64mL/sq-m (Normal 33-64 mL/sq-m)  Right ventricle:  RV female  RV EF: 51% (normal 52-80%), visually normal.  Absolute volumes:  RV EDV: 90mL (normal 79-175 mL)  RV ESV: 44mL (Normal 13-75 mL)  RV SV: 46mL (Normal 56-110 mL)  CO: 2.6L/min (Normal 2.7-6 L/min)  Indexed volumes:  CI: 1.43L/min/sq-m (Normal 1.8-3.8 L/min/sq-m)  RV EDV: 73mL/sq-m (Normal 51-97 mL/sq-m)  RV ESV: 82mL/sq-m (Normal 9-42 mL/sq-m)  RV SV: 61mL/sq-m (Normal 35-61 mL/sq-m)  IMPRESSION: 1. Normal left ventricular systolic function with normal LV chamber size. LVEF 62%. Mild basal-mid inferoseptal hypertrophy, maximal wall thickness 13 mm. There is faint basal inferior RV insertion point delayed myocardial enhancement, which is nonspecific and may indicated elevated pulmonary pressures. Findings in combination are not consistent with hypertrophic cardiomyopathy, and may be more consistent with hypertensive heart disease.  2. Normal right ventricular systolic function and chamber  size. No delayed myocardial  enhancement.  3. Aortic valve is abnormal. Leaflets appear to have moderately restricted motion and turbulence/flow acceleration through valve. Consider echocardiogram to assess severity of aortic valve stenosis which appears at least mild on echo 06/14/21.   Electronically Signed By: Soyla Merck M.D. On: 08/08/2023 08:08   ______________________________________________________________________________________________        Physical Exam:    VS:  BP (!) 148/83 (BP Location: Left Arm)   Pulse 60   Ht 5' 1 (1.549 m)   Wt 188 lb (85.3 kg)   SpO2 96%   BMI 35.52 kg/m    Wt Readings from Last 3 Encounters:  08/18/23 188 lb (85.3 kg)  04/20/23 175 lb (79.4 kg)  04/11/22 188 lb (85.3 kg)    Gen: no distress, morbid obesity  Neck: No JVD, no carotid bruit Cardiac: No Rubs or Gallops, Harsh systolic standing Murmur, RRR +2 radial pulses Respiratory: Clear to auscultation bilaterally, normal effort, normal  respiratory rate GI: Soft, nontender, non-distended  MS: No  edema;  moves all extremities Integument: Skin feels warm Neuro:  At time of evaluation, alert and oriented to person/place/time/situation  Psych: Normal affect, patient feels ok   ASSESSMENT AND PLAN: .    Aortic stenosis - Mild aortic stenosis with a prominent heart murmur. MRI showed aortic valve turbulence. No evidence of hypertrophic cardiomyopathy. Differential includes calcified valve due to age. No family history of bicuspid aortic valve or chest irradiation. - Order echocardiogram with PEDOFF probe to evaluate valve movement and calcification - Order calcium score to assess valve calcification  Paroxysmal supraventricular tachycardia Intermittent symptoms requiring diltiazem  for management. Symptoms include heart rate increase with exertion. Frequency of diltiazem  use will guide potential dosage adjustment. - Refill diltiazem  prescription - Monitor frequency of diltiazem  use to determine if dosage  adjustment is needed (at heart rate of 60 would need ADD therapy given her BB dose if worsening)  Hypertensive heart disease - Blood pressure elevated today, typically ranges from 120-130/60. Managed with amlodipine.  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   Follow-up Plan to reassess symptoms and test results in the fall. Consider exercise testing if symptoms persist and no clear diagnosis is made (2023 Ischemic study was a Lexiscan ). - Schedule follow-up appointment in the fall with either Dr. Santo or Kaitlyn West - Consider exercise testing if symptoms persist and no clear diagnosis is made  Time Spent Directly with Patient:   I have spent a total of 42 minutes with the patient reviewing notes, imaging, EKGs, labs, and examining the patient as well as establishing an assessment and plan that was discussed personally with the patient. Personally reviewed prior echo and CMR with patient   Stanly Santo, MD FASE [facility] Cardiologist [facility]  [address] [place], [address] [place], [place] [address] [phone]  4:11 PM

## 2023-08-18 NOTE — Patient Instructions (Signed)
 Medication Instructions:  Your physician recommends that you continue on your current medications as directed. Please refer to the Current Medication list given to you today.  *If you need a refill on your cardiac medications before your next appointment, please call your pharmacy*  Lab Work: NONE  If you have labs (blood work) drawn today and your tests are completely normal, you will receive your results only by: MyChart Message (if you have MyChart) OR A paper copy in the mail If you have any lab test that is abnormal or we need to change your treatment, we will call you to review the results.  Testing/Procedures: Your physician has ordered you to have non-invasive CT for Calcium Scoring of your heart. It will calculate your risk of developing Coronary Artery Disease (CAD) by measuring the amount of buildup of calcium in the plaque in the coronary arteries (arteries surrounding your heart).    Follow-Up: At [facility], you and your health needs are our priority.  As part of our continuing mission to provide you with exceptional heart care, our providers are all part of one team.  This team includes your primary Cardiologist (physician) and Advanced Practice Providers or APPs (Physician Assistants and Nurse Practitioners) who all work together to provide you with the care you need, when you need it.  Your next appointment:   3 month(s)  Provider:   Stanly DELENA Leavens, MD or Katlyn West, NP

## 2023-08-21 ENCOUNTER — Telehealth: Payer: Self-pay | Admitting: Internal Medicine

## 2023-08-21 NOTE — Telephone Encounter (Signed)
 I will re-fax the clearance notes to the dental office.

## 2023-08-21 NOTE — Telephone Encounter (Signed)
 Caller Edwardo) stated patient will be having extractions on 7/31 and they had not received the clearance.  Caller wants clearance re-faxed to fax# [phone].

## 2023-09-15 ENCOUNTER — Ambulatory Visit (HOSPITAL_COMMUNITY)

## 2023-09-21 ENCOUNTER — Ambulatory Visit (HOSPITAL_COMMUNITY)

## 2023-10-18 ENCOUNTER — Ambulatory Visit: Payer: Self-pay | Admitting: Cardiology

## 2023-10-18 ENCOUNTER — Ambulatory Visit (HOSPITAL_COMMUNITY)

## 2023-10-18 ENCOUNTER — Ambulatory Visit (HOSPITAL_COMMUNITY)
Admission: RE | Admit: 2023-10-18 | Discharge: 2023-10-18 | Disposition: A | Discharge status: Home / Self Care | Source: Ambulatory Visit | Attending: Cardiovascular Disease | Admitting: Cardiovascular Disease

## 2023-10-18 ENCOUNTER — Encounter: Payer: Self-pay | Admitting: Cardiology

## 2023-10-18 DIAGNOSIS — R011 Cardiac murmur, unspecified: Secondary | ICD-10-CM

## 2023-10-18 DIAGNOSIS — I350 Nonrheumatic aortic (valve) stenosis: Secondary | ICD-10-CM | POA: Diagnosis present

## 2023-10-18 DIAGNOSIS — R002 Palpitations: Secondary | ICD-10-CM

## 2023-10-18 DIAGNOSIS — I517 Cardiomegaly: Secondary | ICD-10-CM

## 2023-10-18 LAB — ECHOCARDIOGRAM COMPLETE
AR max vel: 0.76 cm2
AV Area VTI: 0.82 cm2
AV Area mean vel: 0.89 cm2
AV Mean grad: 13 mmHg
AV Peak grad: 28.3 mmHg
Ao pk vel: 2.66 m/s
Area-P 1/2: 2.9 cm2
S' Lateral: 1.9 cm

## 2023-11-06 ENCOUNTER — Other Ambulatory Visit (HOSPITAL_COMMUNITY): Payer: Self-pay

## 2023-11-06 MED ORDER — METOPROLOL TARTRATE 25 MG PO TABS
25.0000 mg | ORAL_TABLET | Freq: Once | ORAL | 0 refills | Status: AC
  Filled 2023-11-06: qty 1, 1d supply, fill #0

## 2023-11-16 ENCOUNTER — Ambulatory Visit (HOSPITAL_COMMUNITY)
Admission: RE | Admit: 2023-11-16 | Discharge: 2023-11-16 | Disposition: A | Discharge status: Home / Self Care | Source: Ambulatory Visit | Attending: Internal Medicine | Admitting: Internal Medicine

## 2023-11-16 ENCOUNTER — Other Ambulatory Visit (HOSPITAL_COMMUNITY): Payer: Self-pay

## 2023-11-16 DIAGNOSIS — I350 Nonrheumatic aortic (valve) stenosis: Secondary | ICD-10-CM | POA: Insufficient documentation

## 2023-11-16 DIAGNOSIS — R011 Cardiac murmur, unspecified: Secondary | ICD-10-CM | POA: Insufficient documentation

## 2023-11-16 DIAGNOSIS — R002 Palpitations: Secondary | ICD-10-CM | POA: Insufficient documentation

## 2023-11-16 DIAGNOSIS — I517 Cardiomegaly: Secondary | ICD-10-CM | POA: Diagnosis not present

## 2023-11-16 LAB — POCT I-STAT CREATININE: Creatinine, Ser: 1.1 mg/dL — ABNORMAL HIGH (ref 0.44–1.00)

## 2023-11-16 MED ORDER — NITROGLYCERIN 0.4 MG SL SUBL
0.8000 mg | SUBLINGUAL_TABLET | Freq: Once | SUBLINGUAL | Status: AC
  Administered 2023-11-16: 0.8 mg via SUBLINGUAL

## 2023-11-16 MED ORDER — IOHEXOL 350 MG/ML SOLN
100.0000 mL | Freq: Once | INTRAVENOUS | Status: AC | PRN
  Administered 2023-11-16: 100 mL via INTRAVENOUS

## 2023-11-20 ENCOUNTER — Ambulatory Visit: Payer: Self-pay | Admitting: Internal Medicine

## 2023-11-21 NOTE — Telephone Encounter (Signed)
-----   Message from Stanly DELENA Leavens sent at 11/20/2023 11:55 AM EDT ----- Results: Bicuspid AV with minimal CAD Plan: Non-urgent f/u for BP support Results to PCP  Stanly DELENA Leavens, MD  ----- Message ----- From: Interface, Rad Results In Sent: 11/19/2023   7:40 PM EDT To: Stanly DELENA Leavens, MD

## 2023-11-21 NOTE — Telephone Encounter (Signed)
 Called left detailed message for patient to return call for an appt to discuss recent CCTA.  Patient has reviewed result via MyChart.  Dr Santo  recommend patient schedule an appt to discuss results with either him or Katlyn West NP.  Left message for patient to call back for an appt - scheduler can schedule or any triage nurse can handle.

## 2023-12-28 NOTE — Progress Notes (Signed)
 Subjective  HPI Rachel Brock is a 63 year old female here today for Complete Physical Exam (Karel Aara Jacquot is here for CPE: PAP: Hysterectomy, only has a uterus. Mammogram: last completed 2023, she is due.Immunizations: Flu, COVID, Pneumonia and Shingles are due. She declined all recommended vaccines. Colonoscopy: 09/2020. )   She was last seen in this office on 08/15/23 for T2DM and depression f/u with concerns of dysuria. She was counseled on diet/lifestyle changes and was instructed to RTC in 3 months for CPE w/ labs. She is here today alone and shares that she's living with daughter. She 2 daughters in total and 3 grandchildren (2 girls; 1 boy) and is currently single; previosuly divorced. 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   Diet - 1-2 meals/day; occasional snacks - eats vegetables, min. Fruits Water - 4 bottles/day Juice/Soda - min. Not often  Tea- occasionally Exercise - walking x 3 times/week at least 10-20 mins/day  Tobacco - Former Tobacco User stopped x 40 years ago Alcohol - denies Drugs - Denies Sexually active - No   Intimate partner violence - denies Anxiety/depression - I'm working on it  Pap - Partial hysterectomy (has ovaries; no uterus)  Mammogram - x 08/17/2021  Colonoscopy - 09/2020; repeat in 3 years Last eye exam - upcoming in Jan 2026 Last dental exam - x 2 weeks ago; has dentures  Glucose readings: been in the 180s (has had dizziness and H/As) -SBP readings: 160s - 180s   -DBP readings: 80s   Patient Active Problem List   Diagnosis Date Noted   Obstructive hypertrophic cardiomyopathy (CMS & HHS-HCC) 09/02/2021   Diabetes Mellitus, Type II    Kidney stones 02/09/2021   GERD (gastroesophageal reflux disease) 02/09/2021   Elevated alkaline phosphatase level 10/13/2020   Hypercalcemia 05/18/2020   Constipation 10/25/2017   Memory loss, short term 08/04/2017   Vitamin D deficiency 07/03/2017   Knee  pain, bilateral 07/03/2017   Cholelithiasis 12/24/2012   Hypertension, Benign Essential 09/21/2012   Allergic Rhinitis 09/21/2012   Gout 09/21/2012   Hyperlipidemia 09/21/2012    Past Medical History:  Diagnosis Date   Anxiety state    DM (diabetes mellitus) (CMS & HHS-HCC)    Essential hypertension    Gastroesophageal reflux disease    Gout    Hyperlipidemia     Past Surgical History:  Procedure Laterality Date   HYSTERECTOMY, PARTIAL     uterus was removed    Tobacco History   Tobacco Use  Smoking Status Former   Current packs/day: 0.00   Types: Cigarettes  Smokeless Tobacco Never   Social History   Substance and Sexual Activity  Alcohol Use Not Currently   Social History   Substance and Sexual Activity  Drug Use Not Currently   Social History   Substance and Sexual Activity  Sexual Activity Not Currently    No data to display    Family History  Problem Relation Name Age of Onset   Stroke Mother     Heart Problems Father       Review of Systems  Constitutional:  Negative for fatigue and fever.  HENT:  Negative for congestion, rhinorrhea, sinus pain, sneezing and sore throat.   Eyes:  Negative for pain.  Respiratory:  Negative for cough and shortness of breath.   Cardiovascular:  Negative for chest pain and palpitations.  Breasts: Negative. Negative for lumps, pain, nipple discharge and skin changes.  Gastrointestinal:  Negative for abdominal pain, constipation, nausea  and vomiting.  Genitourinary:  Negative for difficulty urinating, dysuria, frequency and vaginal discharge.  Neurological:  Negative for dizziness and headaches.  Psychiatric/Behavioral:  Negative for hallucinations and self-injury.     Objective   Vitals:   12/28/23 1445  BP: (!) 152/82  Pulse: 60  Temp: 97.8 F (36.6 C)  TempSrc: Oral  SpO2: 97%  Weight: 176 lb (79.8 kg)  Height: 5' 0.63 (1.54 m)   Estimated body mass index is 33.66 kg/m as  calculated from the following:   Height as of this encounter: 5' 0.63 (1.54 m).   Weight as of this encounter: 176 lb (79.8 kg). Facility age limit for growth %iles is 20 years.   Physical Exam Vitals and nursing note reviewed.   Constitutional:      General: Rachel Brock is not in acute distress.    Appearance: Normal appearance. Rachel Brock is well-developed. Rachel Brock is not toxic-appearing. Rachel Brock has obesity HENT:     Head: Normocephalic and atraumatic. Not macrocephalic.     Right Ear: Tympanic membrane, ear canal and external ear normal.     Left Ear: Tympanic membrane, ear canal and external ear normal.     Nose: Nose normal.     Mouth/Throat:     Mouth: Mucous membranes are moist.     Pharynx: Oropharynx is clear. Uvula midline. No oropharyngeal exudate.  Eyes:     General: Lids are normal.     Conjunctiva/sclera: Conjunctivae normal.     Pupils: Pupils are equal, round, and reactive to light.  Neck:     Thyroid: No thyroid mass or thyromegaly.     Trachea: Trachea normal. No tracheal deviation.   Cardiovascular:     Rate and Rhythm: Normal rate and regular rhythm.     Chest Wall: PMI is not displaced.     Pulses: Normal pulses.          Dorsalis pedis pulses are 2+ on the right side and 2+ on the left side.       Posterior tibial pulses are 2+ on the right side and 2+ on the left side.     Heart sounds: Murmur heard.  Pulmonary:     Effort: Pulmonary effort is normal. No accessory muscle usage or respiratory distress.     Breath sounds: Normal breath sounds. No decreased breath sounds or wheezing.  Abdominal:     General: Bowel sounds are normal.     Palpations: Abdomen is soft. There is no hepatomegaly or splenomegaly.     Tenderness: There is no abdominal tenderness.  Musculoskeletal:        General: No tenderness or signs of injury. Normal range of motion.     Cervical back: Full passive range of motion without pain and neck supple. No edema. Normal range of motion.      Right lower leg: No edema.     Left lower leg: No edema.  Feet:     Right foot:     Intact sensation in R 1st digit, R 3rd digit, R 5th digit, R 1st metatarsal, R 3rd metatarsal, R 5th metatarsal, R medial, R lateral, R calcaneus and R dorsum.    Skin integrity: Skin integrity normal.     Toenail Condition: Right toenails are normal.     Left foot:    Intact sensation in L 1st digit, L 3rd digit, L 5th digit, L 1st metatarsal, L 3rd metatarsal, L 5th metatarsal, L medial, L lateral, L calcaneus and L dorsum.  Skin integrity: Skin integrity normal.     Toenail Condition: Left toenails are normal.    comprehensive diabetes foot exam completed Lymphadenopathy:     Cervical: No cervical adenopathy.  Skin:    General: Skin is warm and dry.     Coloration: Skin is not pale.     Findings: No abrasion.  Neurological:     General: No focal deficit present.     Mental Status: Rachel Brock is alert and oriented to person, place, and time.     Motor: No tremor, atrophy, abnormal muscle tone or seizure activity.     Gait: Gait normal.  Psychiatric:        Mood and Affect: Mood normal.        Speech: Speech normal.        Behavior: Behavior normal. Behavior is cooperative.        Thought Content: Thought content normal.        Judgment: Judgment normal.    Assessment and Plan  Discussed with the pt healthy diet: balanced with fruits and vegetables, limit sweets/salty foods/fried and greasy foods. Discussed exercise: minimum 120 min/week of moderate exercise. Pt is not sexually active. They denies intimate partner violence. They have been screened for hepatitis HIV syphilis and will not be screened today. Pt denies anxiety/depression.   1. Encounter for annual physical exam (Primary) - REFERRAL TO MENTAL HEALTH  2. Type 2 diabetes mellitus without complication, without long-term current use of insulin (CMS & HHS-HCC) -A1C has improved from previous 9.6% to now 7.3%- compliant with current  regimen of Metformin and Rybelsis.  -Will continue current regimen and continue to monitor. - metFORMIN (GLUCOPHAGE) 1,000 mg tablet; Take 1 Tablet by mouth 2 (two) times daily with a meal.  Dispense: 180 Tablet; Refill: 1 - GLUCOSE BLOOD (POCT) Routine - A1C, ALERE AFINION (POCT) Routine  3. Arthralgia of both lower legs -Prescribed / refilled Tramadol . New pain mgmt contract signed and UDS pending. - traMADoL  (ULTRAM ) 50 mg tablet; Take 1 Tablet by mouth every 6 (six) hours as needed for pain.  Dispense: 60 Tablet; Refill: 3  4. Hypertension, Benign Essential 5. Aortic valve stenosis, etiology of cardiac valve disease unspecified 6. Murmur, cardiac -Followed by Cardiology; she shares that she was told to discuss stopping her HCTZ with this provider by her PCP, but was not told exactly why.  -Elevated BP today with no improvement; chart review - recently had several imaging studies completed including CT calcium score, ECHO and CTA.  -Has an upcoming appointment with Cardiology on 01/04/24; will start on Hydralazine TID and instructed to monitor BP readings closely.   -Will reach out to cardiology office as she is noted to be on 3-4 HTN agents with little to no improvement.  - losartan (COZAAR) 100 mg tablet; Take 1 Tablet by mouth daily.  Dispense: 90 Tablet; Refill: 1 - BASIC METABOLIC PANEL CALCIUM TOTAL Routine - TSH + FREE T4 Routine - LIPID PANEL W/TOT CHOL/HDL RATIO Routine - hydrALAZINE (APRESOLINE) 25 mg tablet; Take 1 Tablet by mouth 3 (three) times daily.  Dispense: 90 Tablet; Refill: 1  7. Encounter for drug screening - URINE DRUG SCREEN 7 DRUGS + ETOH Blood Routine  8. Vitamin D deficiency, unspecified - Vitamin D, 25-Hydroxy Routine  9. Encounter for screening mammogram for malignant neoplasm of breast - SCREENING MAMMOGRAPHY BI 2-VIEW BREAST INC CAD   Return in about 1 month (around 01/28/2024) for in 1-2 months for chronic disease f/u.   Annabella Rigg,  NP Family  Nurse Practitioner [facility]

## 2024-01-02 ENCOUNTER — Other Ambulatory Visit: Payer: Self-pay | Admitting: Family

## 2024-01-02 DIAGNOSIS — Z1231 Encounter for screening mammogram for malignant neoplasm of breast: Secondary | ICD-10-CM

## 2024-01-02 NOTE — Progress Notes (Unsigned)
 Cardiology Office Note    Date:  01/02/2024  ID:  Rachel Brock, DOB [birth year], MRN [id number] PCP:  Rachel Annabella SAILOR, FNP  Cardiologist:  Rachel DELENA Leavens, MD  Electrophysiologist:  None   Chief Complaint: ***  History of Present Illness: .    Rachel Brock is a 63 y.o. female with visit-pertinent history of  HTN, palpitations and HOCM.    Patient was first evaluated by Dr. Alvan on 4/727/23 at the request of her PCP for reports of a heart murmur.  Patient reported some shortness of breath and irregular heart rates.  Reported that when she was walking she could become short of breath that have been ongoing for 3 months.  Nuclear stress test in 05/2021 was normal and low risk.  Echocardiogram in 05/2021 indicated LVEF of 60 to 65%, no RWMA, moderate LVH of the basal septal segment, grade 2 diastolic dysfunction, right ventricular function and size was normal.  There were no significant valvular abnormalities.  Cardiac monitor in 06/2021 indicated predominant underlying rhythm is sinus rhythm, average heart rate of 76 bpm, ranging from 49 to 156 bpm's.  There were 8 triggered events, triggered events were associated with SVT, sinus rhythm and sinus tachycardia.   Repeat cardiac monitor in 04/2022 indicated predominant underlying rhythm was sinus rhythm with an average heart rate of 73 bpm ranging from 47 to 132 bpm.  There were 3 triggered events associated with sinus rhythm and occasional PVCs.  Patient was seen in clinic in 03/2023 for preoperative cardiac evaluation.  She reported to be doing well, had stayed stable from a cardiac standpoint.  Cardiac MRI was ordered for further evaluation of LVH.  Cardiac MRI in 07/2023 indicated normal LVEF of 62%, mild basal to mid inferior septal hypertrophy, maximal wall thickness 13 mm, faint basal inferior RV insertion point delayed myocardial enhancement, nonspecific may be indicated of elevated pulmonary pressures, findings more consistent with  hypertensive heart disease.  It was noted that the aortic valve was abnormal, leaflets appeared to have moderately restricted motion and turbulent flow, echo was recommended.  Patient was evaluated by Dr. Arnetha on 08/18/2023, patient reported experiencing vibrations and increased heart rate especially during physical exertion like lifting heavy objects or walking into her house.  She noticed experiencing shortness of breath particular and walking out of the park.  Echocardiogram and calcium score was ordered for further evaluation.  Echocardiogram on 10/18/2023 indicated LVEF 65 to 70%, no RWMA, moderate concentric LVH, G1 DD, RV systolic function and size was normal, trivial mitral valve regurgitation with no evidence of stenosis, mild calcification of the aortic valve, mild aortic valve stenosis.  Coronary CTA indicated coronary calcium score of 0, minimal noncalcified plaque less than 25% in the RCA.  Noted that aortic valve appeared bicuspid with fusion of the RCC/LCC.  Today she presents for follow-up.  She reports that she  Bicuspid aortic vavle: Coronary CTA indicated bicuspid AV valve with fusion of RCC/LCC.  Echocardiogram in 09/2023 indicated mild aortic valve stenosis. Today she reports  Nonobstructive RJI:Rnmnwjmb CTA indicated coronary calcium score of 0, minimal noncalcified plaque less than 25% in the RCA.  Today she reports Heart healthy diet and regular cardiovascular exercise encouraged.    Hypertension: Blood pressure today  Hyperlipidemia:  Palpitation/SVT: Cardiac monitor in 06/2021 indicated predominant underlying rhythm is sinus rhythm, average heart rate of 76 bpm, ranging from 49 to 156 bpm's.  There were 8 triggered events, triggered events were associated with SVT, sinus  rhythm and sinus tachycardia. Repeat cardiac monitor in 04/2022 indicated predominant underlying rhythm was sinus rhythm with an average heart rate of 73 bpm ranging from 47 to 132 bpm.  There were 3  triggered events associated with sinus rhythm and occasional PVCs.  Patient has been maintained on metoprolol  succinate 50 mg daily, Cardizem  as needed.     Labwork independently reviewed:   ROS: .   *** denies chest pain, shortness of breath, lower extremity edema, fatigue, palpitations, melena, hematuria, hemoptysis, diaphoresis, weakness, presyncope, syncope, orthopnea, and PND.  All other systems are reviewed and otherwise negative.  Studies Reviewed: Rachel Brock    EKG:  EKG is ordered today, personally reviewed, demonstrating ***     CV Studies: Cardiac studies reviewed are outlined and summarized above. Otherwise please see EMR for full report. Cardiac Studies & Procedures   ______________________________________________________________________________________________   STRESS TESTS  MYOCARDIAL PERFUSION IMAGING 06/01/2021  Interpretation Summary   The study is normal. The study is low risk.   No ST deviation was noted.   LV perfusion is normal. There is no evidence of ischemia. There is no evidence of infarction.   Left ventricular function is normal. Nuclear stress EF: 87 %. The left ventricular ejection fraction is hyperdynamic (>65%). End diastolic cavity size is normal. End systolic cavity size is normal.   Prior study not available for comparison.   ECHOCARDIOGRAM  ECHOCARDIOGRAM COMPLETE 10/18/2023  Narrative ECHOCARDIOGRAM REPORT    [name] Date of Exam: 10/18/2023 Medical Rec #:  [id number]      Height:       61.0 in Accession #:    [id number]     Weight:       188.0 lb Date of Birth:  11-11-1960      BSA:          1.840 m Patient Age:    62 years       BP:           148/83 mmHg Patient Gender: F              HR:           50 bpm. Exam Location:  [address]  Procedure: 2D Echo, Cardiac Doppler, Color Doppler, 3D Echo and Strain Analysis (Both Spectral and Color Flow Doppler were utilized during procedure).  Indications:    I35.0  AS  History:        Patient has prior history of Echocardiogram examinations, most recent 06/14/2021. LVH, AS; Risk Factors:Former Smoker, Palpitations, Hypertension and Diabetes.  Sonographer:    Rachel Brock RDCS Referring Phys: [id number] TRACI R TURNER  IMPRESSIONS   1. Left ventricular ejection fraction, by estimation, is 65 to 70%. Left ventricular ejection fraction by 3D volume is 69 %. The left ventricle has normal function. The left ventricle has no regional wall motion abnormalities. There is moderate concentric left ventricular hypertrophy. Left ventricular diastolic parameters are consistent with Grade I diastolic dysfunction (impaired relaxation). Elevated left ventricular end-diastolic pressure. The average left ventricular global longitudinal strain is -19.3 %. 2. Right ventricular systolic function is normal. The right ventricular size is normal. 3. The mitral valve is normal in structure. Trivial mitral valve regurgitation. No evidence of mitral stenosis. 4. The aortic valve is tricuspid. There is mild calcification of the aortic valve. There is mild thickening of the aortic valve. Aortic valve regurgitation is not visualized. Mild aortic valve stenosis. Aortic valve area, by VTI measures 0.82 cm. Aortic valve  mean gradient measures 13.0 mmHg. Aortic valve Vmax measures 2.66 m/s. 5. The inferior vena cava is normal in size with greater than 50% respiratory variability, suggesting right atrial pressure of 3 mmHg.  FINDINGS Left Ventricle: Left ventricular ejection fraction, by estimation, is 65 to 70%. Left ventricular ejection fraction by 3D volume is 69 %. The left ventricle has normal function. The left ventricle has no regional wall motion abnormalities. The average left ventricular global longitudinal strain is -19.3 %. The left ventricular internal cavity size was normal in size. There is moderate concentric left ventricular hypertrophy. Left ventricular diastolic parameters  are consistent with Grade I diastolic dysfunction (impaired relaxation). Elevated left ventricular end-diastolic pressure.  Right Ventricle: The right ventricular size is normal. No increase in right ventricular wall thickness. Right ventricular systolic function is normal.  Left Atrium: Left atrial size was normal in size.  Right Atrium: Right atrial size was normal in size.  Pericardium: There is no evidence of pericardial effusion.  Mitral Valve: The mitral valve is normal in structure. Trivial mitral valve regurgitation. No evidence of mitral valve stenosis.  Tricuspid Valve: The tricuspid valve is normal in structure. Tricuspid valve regurgitation is not demonstrated. No evidence of tricuspid stenosis.  Aortic Valve: The aortic valve is tricuspid. There is mild calcification of the aortic valve. There is mild thickening of the aortic valve. Aortic valve regurgitation is not visualized. Mild aortic stenosis is present. Aortic valve mean gradient measures 13.0 mmHg. Aortic valve peak gradient measures 28.3 mmHg. Aortic valve area, by VTI measures 0.82 cm.  Pulmonic Valve: The pulmonic valve was normal in structure. Pulmonic valve regurgitation is mild. No evidence of pulmonic stenosis.  Aorta: The aortic root is normal in size and structure.  Venous: The inferior vena cava is normal in size with greater than 50% respiratory variability, suggesting right atrial pressure of 3 mmHg.  IAS/Shunts: No atrial level shunt detected by color flow Doppler.  Additional Comments: 3D was performed not requiring image post processing on an independent workstation and was normal.   LEFT VENTRICLE PLAX 2D LVIDd:         3.30 cm         Diastology LVIDs:         1.90 cm         LV e' medial:    5.87 cm/s LV PW:         1.20 cm         LV E/e' medial:  15.3 LV IVS:        1.50 cm         LV e' lateral:   6.85 cm/s LVOT diam:     1.80 cm         LV E/e' lateral: 13.2 LV SV:         53 LV SV  Index:   29              2D Longitudinal LVOT Area:     2.54 cm        Strain LV IVRT:       111 msec        2D Strain GLS   -18.6 % (A4C): 2D Strain GLS   -18.0 % (A3C): 2D Strain GLS   -21.3 % (A2C): 2D Strain GLS   -19.3 % Avg:  3D Volume EF LV 3D EF:    Left ventricul ar ejection fraction by 3D volume is 69 %.  3D Volume EF: 3D EF:  69 % LV EDV:       99 ml LV ESV:       31 ml LV SV:        68 ml  RIGHT VENTRICLE RV S prime:     16.80 cm/s  PULMONARY VEINS TAPSE (M-mode): 2.1 cm      Diastolic Velocity: 48.30 cm/s S/D Velocity:       1.20 Systolic Velocity:  58.40 cm/s  LEFT ATRIUM             Index        RIGHT ATRIUM           Index LA diam:        3.70 cm 2.01 cm/m   RA Pressure: 3.00 mmHg LA Vol (A2C):   40.0 ml 21.74 ml/m  RA Area:     10.40 cm LA Vol (A4C):   38.4 ml 20.87 ml/m  RA Volume:   21.20 ml  11.52 ml/m LA Biplane Vol: 40.0 ml 21.74 ml/m AORTIC VALVE AV Area (Vmax):    0.76 cm AV Area (Vmean):   0.89 cm AV Area (VTI):     0.82 cm AV Vmax:           266.00 cm/s AV Vmean:          159.200 cm/s AV VTI:            0.642 m AV Peak Grad:      28.3 mmHg AV Mean Grad:      13.0 mmHg LVOT Vmax:         79.80 cm/s LVOT Vmean:        55.500 cm/s LVOT VTI:          0.208 m LVOT/AV VTI ratio: 0.32  AORTA Ao Root diam: 2.70 cm Ao Asc diam:  3.20 cm  MITRAL VALVE                TRICUSPID VALVE MV Area (PHT): 2.90 cm     Estimated RAP:  3.00 mmHg MV Decel Time: 262 msec MV E velocity: 90.10 cm/s   SHUNTS MV A velocity: 102.00 cm/s  Systemic VTI:  0.21 m MV E/A ratio:  0.88         Systemic Diam: 1.80 cm  Annabella Scarce MD Electronically signed by Annabella Scarce MD Signature Date/Time: 10/18/2023/4:54:26 PM    Final    MONITORS  LONG TERM MONITOR (3-14 DAYS) 05/19/2022  Narrative 3 triggered events for sinus rhythm, occasional PVCs  Patch Wear Time:  13 days and 23 hours (2024-04-08T11:47:02-398 to  2024-04-22T11:07:00-0400)  Patient had a min HR of 47 bpm, max HR of 132 bpm, and avg HR of 73 bpm. Predominant underlying rhythm was Sinus Rhythm. QRS morphology changes were present throughout recording. Isolated SVEs were rare (<1.0%), SVE Couplets were rare (<1.0%), and no SVE Triplets were present. Isolated VEs were occasional (2.3%, [id number]), and no VE Couplets or VE Triplets were present. Ventricular Bigeminy and Trigeminy were present.   CT SCANS  CT CORONARY MORPH W/CTA COR W/SCORE 11/16/2023  Narrative CLINICAL DATA:  Aortic stenosis.  EXAM: Cardiac/Coronary CTA  TECHNIQUE: A non-contrast, gated CT scan was obtained with axial slices of 2.5 mm through the heart for calcium scoring. Calcium scoring was performed using the Agatston method. A 120 kV prospective, gated, contrast cardiac CT scan was obtained. Gantry rotation speed was 230 msec and collimation was 0.63 mm. Two sublingual nitroglycerin  tablets (0.8 mg) were given. The 3D data set was  reconstructed with motion correction for the best systolic or diastolic phase. Images were analyzed on a dedicated workstation using MPR, MIP, and VRT modes. The patient received 95 cc of contrast.  FINDINGS: Image quality: Excellent.  Noise artifact is: Limited.  Coronary Arteries:  Normal coronary origin.  Right dominance.  Left main: The left main is a large caliber vessel with a normal take off from the left coronary cusp that trifurcates into a LAD, LCX, and ramus intermedius. There is no plaque or stenosis.  Left anterior descending artery: The LAD is patent without evidence of plaque or stenosis. The LAD gives off 1 patent diagonal branch.  Ramus intermedius: Patent with no evidence of plaque or stenosis.  Left circumflex artery: The LCX is non-dominant and patent with no evidence of plaque or stenosis. The LCX gives off 2 patent obtuse marginal branches.  Right coronary artery: The RCA is dominant with normal take  off from the right coronary cusp. There is minimal non-calcified plaque (<25%). The RCA terminates as a PDA without evidence of plaque or stenosis.  Right Atrium: Right atrial size is within normal limits.  Right Ventricle: The right ventricular cavity is within normal limits.  Left Atrium: Left atrial size is normal in size with no left atrial appendage filling defect.  Left Ventricle: The ventricular cavity size is within normal limits.  Pulmonary arteries: Normal in size.  Pulmonary veins: Normal pulmonary venous drainage.  Pericardium: Normal thickness without significant effusion or calcium present.  Cardiac valves: The aortic valve appears bicuspid with fusion of the RCC/LCC. Mild leaflet calcification. Aortic valve calcium score 300. The leaflets open well with AVA 1.54 cm2. The mitral valve is normal without significant calcification.  Aorta: Normal caliber without significant disease.  Extra-cardiac findings: See attached radiology report for non-cardiac structures.  IMPRESSION: 1. Coronary calcium score of 0.  2. Normal coronary origin with right dominance.  3. Minimal non-calcified plaque (<25%) in the RCA.  4. The aortic valve appears bicuspid with fusion of the RCC/LCC. The leaflets open well. Aortic valve calcium score 300. The AVA is 1.54 cm2 by planimetry.  RECOMMENDATIONS: 1. CAD-RADS 1: Minimal non-obstructive CAD (0-24%). Consider non-atherosclerotic causes of chest pain. Consider preventive therapy and risk factor modification.  Darryle Decent, MD   Electronically Signed By: Darryle Decent M.D. On: 11/19/2023 19:38   CARDIAC MRI  MR CARDIAC MORPHOLOGY W WO CONTRAST 08/07/2023  Narrative CLINICAL DATA:  Cardiomyopathy suspected LVH  COMPARISON: Echo 06/14/21  EXAM: MR CARDIA MORPHOLOGY WITHOUT AND WITH CONTRAST; MR CARDIAC VELOCITY FLOW MAPPING  TECHNIQUE: The patient was scanned on a 1.5 Tesla Siemens magnet. A dedicated cardiac  coil was used. Functional imaging was done using TrueFisp sequences. 2,3, and 4 chamber views were done to assess for RWMA's. Modified Simpson's rule using a short axis stack was used to calculate an ejection fraction on a dedicated work [employer]. The patient received 10mL GADAVIST  GADOBUTROL  1 MMOL/ML IV SOLN. After 10 minutes inversion recovery sequences were used to assess for infiltration and scar tissue. Phase contrast velocity encoded images obtained x 2.  This examination is tailored for evaluation cardiac anatomy and function and provides very limited assessment of noncardiac structures, which are accordingly not evaluated during interpretation. If there is clinical concern for extracardiac pathology, further evaluation with CT imaging should be considered.  FINDINGS: LEFT VENTRICLE:  Left ventricular chamber size: Normal.  Left ventricular wall thickness: Mildly increased. Maximal wall thickness 13 mm in the basal-mid inferoseptum wall.  There is no systolic anterior motion of the mitral valve. No flow dephasing to suggest left ventricular outflow tract obstruction. Mitral regurgitation is not visually seen.  No evidence of left ventricular apical aneurysm.  Findings are not consistent with hypertrophic cardiomyopathy with obstruction.  Left ventricular systolic function: Normal  LVEF = 62%  There are no regional wall motion abnormalities.  No myocardial edema, T2 = 47 msec  Normal first pass perfusion.  There is post contrast delayed myocardial enhancement: Faint inferior RV insertion point LGE, may indicated increased pulmonary pressures. This is otherwise nonspecific. No definite LGE seen in area of maximal wall thickness.  Normal T1 myocardial nulling kinetics suggest against a diagnosis of cardiac amyloidosis.  ECV = 25%, using HCT 41%  RIGHT VENTRICLE:  Normal right ventricular chamber size.  Normal right ventricular wall  thickness.  Normal right ventricular systolic function.  RVEF = 51%, visually appears higher.  There are no regional wall motion abnormalities.  No post contrast delayed myocardial enhancement.  ATRIA:  Grossly normal  PERICARDIUM:  Normal pericardium.  Trivial pericardial effusion.  OTHER: No significant extracardiac findings.  MEASUREMENTS: Qp/Qs: 1.03  VALVES:  Aortic valve: Abnormal - appears to have moderately restricted motion and turbulence/flow acceleration through valve. Consider echocardiogram to assess severity of aortic valve stenosis which was mild on echo 06/14/21. VENC peak set at 200, therefore quantitative assessment AS not evaluated on this exam.  Aortic valve regurgitation: Trivial, regurgitant fraction 5%  Pulmonary valve regurgitation: Trace, regurgitant fraction 1%  Mitral valve regurgitation: Mild, regurgitant fraction 17%  Tricuspid valve regurgitation: Mild, regurgitant fraction 13%  Left ventricle:  LV female  LV EF: 62 % (Normal 52-79%)  Absolute volumes:  LV EDV: 78mL (Normal 78-167 mL)  LV ESV: 29mL (Normal 21-64 mL)  LV SV: 48mL (Normal 52-114 mL)  CO: 2.8L/min (Normal 2.7-6.3 L/min)  Indexed volumes:  CI: 1.5L/min/sq-m (Normal 1.9-3.9 L/min/sq-m)  LV EDV: 40mL/sq-m (Normal 50-96 mL/sq-m)  LV ESV: 68mL/sq-m (Normal 10-40 mL/sq-m)  LV SV: 34mL/sq-m (Normal 33-64 mL/sq-m)  Right ventricle:  RV female  RV EF: 51% (normal 52-80%), visually normal.  Absolute volumes:  RV EDV: 90mL (normal 79-175 mL)  RV ESV: 44mL (Normal 13-75 mL)  RV SV: 46mL (Normal 56-110 mL)  CO: 2.6L/min (Normal 2.7-6 L/min)  Indexed volumes:  CI: 1.43L/min/sq-m (Normal 1.8-3.8 L/min/sq-m)  RV EDV: 51mL/sq-m (Normal 51-97 mL/sq-m)  RV ESV: 70mL/sq-m (Normal 9-42 mL/sq-m)  RV SV: 26mL/sq-m (Normal 35-61 mL/sq-m)  IMPRESSION: 1. Normal left ventricular systolic function with normal LV chamber size. LVEF 62%. Mild basal-mid  inferoseptal hypertrophy, maximal wall thickness 13 mm. There is faint basal inferior RV insertion point delayed myocardial enhancement, which is nonspecific and may indicated elevated pulmonary pressures. Findings in combination are not consistent with hypertrophic cardiomyopathy, and may be more consistent with hypertensive heart disease.  2. Normal right ventricular systolic function and chamber size. No delayed myocardial enhancement.  3. Aortic valve is abnormal. Leaflets appear to have moderately restricted motion and turbulence/flow acceleration through valve. Consider echocardiogram to assess severity of aortic valve stenosis which appears at least mild on echo 06/14/21.   Electronically Signed By: Soyla Merck M.D. On: 08/08/2023 08:08   ______________________________________________________________________________________________       Current Reported Medications:.    No outpatient medications have been marked as taking for the 01/04/24 encounter (Appointment) with Laquana Villari D, NP.    Physical Exam:    VS:  There were no vitals taken for this visit.   Wt  Readings from Last 3 Encounters:  08/18/23 188 lb (85.3 kg)  04/20/23 175 lb (79.4 kg)  04/11/22 188 lb (85.3 kg)    GEN: Well nourished, well developed in no acute distress NECK: No JVD; No carotid bruits CARDIAC: ***RRR, no murmurs, rubs, gallops RESPIRATORY:  Clear to auscultation without rales, wheezing or rhonchi  ABDOMEN: Soft, non-tender, non-distended EXTREMITIES:  No edema; No acute deformity     Asessement and Plan:.     ***     Disposition: F/u with ***  Signed, Luverne Zerkle D Elijha Dedman, NP

## 2024-01-04 ENCOUNTER — Encounter: Payer: Self-pay | Admitting: Cardiology

## 2024-01-04 ENCOUNTER — Ambulatory Visit: Attending: Cardiology | Admitting: Cardiology

## 2024-01-04 VITALS — BP 154/82 | HR 61 | Ht 61.0 in | Wt 179.8 lb

## 2024-01-04 DIAGNOSIS — R0602 Shortness of breath: Secondary | ICD-10-CM | POA: Diagnosis present

## 2024-01-04 DIAGNOSIS — R002 Palpitations: Secondary | ICD-10-CM | POA: Diagnosis present

## 2024-01-04 DIAGNOSIS — I350 Nonrheumatic aortic (valve) stenosis: Secondary | ICD-10-CM

## 2024-01-04 DIAGNOSIS — Z79899 Other long term (current) drug therapy: Secondary | ICD-10-CM | POA: Diagnosis present

## 2024-01-04 DIAGNOSIS — R011 Cardiac murmur, unspecified: Secondary | ICD-10-CM

## 2024-01-04 DIAGNOSIS — I10 Essential (primary) hypertension: Secondary | ICD-10-CM | POA: Insufficient documentation

## 2024-01-04 DIAGNOSIS — I517 Cardiomegaly: Secondary | ICD-10-CM

## 2024-01-04 LAB — BASIC METABOLIC PANEL WITH GFR
BUN/Creatinine Ratio: 8 — ABNORMAL LOW (ref 12–28)
BUN: 7 mg/dL — ABNORMAL LOW (ref 8–27)
CO2: 23 mmol/L (ref 20–29)
Calcium: 10.3 mg/dL (ref 8.7–10.3)
Chloride: 107 mmol/L — ABNORMAL HIGH (ref 96–106)
Creatinine, Ser: 0.93 mg/dL (ref 0.57–1.00)
Glucose: 150 mg/dL — ABNORMAL HIGH (ref 70–99)
Potassium: 4.2 mmol/L (ref 3.5–5.2)
Sodium: 143 mmol/L (ref 134–144)
eGFR: 70 mL/min/1.73 (ref >59)

## 2024-01-04 LAB — MAGNESIUM: Magnesium: 1.9 mg/dL (ref 1.6–2.3)

## 2024-01-04 LAB — COMPREHENSIVE METABOLIC PANEL WITH GFR
ALT: 15 IU/L (ref 0–32)
AST: 17 IU/L (ref 0–40)
Albumin: 3.7 g/dL — ABNORMAL LOW (ref 3.9–4.9)
Alkaline Phosphatase: 117 IU/L (ref 49–135)
BUN/Creatinine Ratio: 6 — ABNORMAL LOW (ref 12–28)
BUN: 6 mg/dL — ABNORMAL LOW (ref 8–27)
Bilirubin Total: 0.2 mg/dL (ref 0.0–1.2)
CO2: 23 mmol/L (ref 20–29)
Calcium: 10 mg/dL (ref 8.7–10.3)
Chloride: 108 mmol/L — ABNORMAL HIGH (ref 96–106)
Creatinine, Ser: 0.97 mg/dL (ref 0.57–1.00)
Globulin, Total: 3 g/dL (ref 1.5–4.5)
Glucose: 153 mg/dL — ABNORMAL HIGH (ref 70–99)
Potassium: 4.3 mmol/L (ref 3.5–5.2)
Sodium: 142 mmol/L (ref 134–144)
Total Protein: 6.7 g/dL (ref 6.0–8.5)
eGFR: 66 mL/min/1.73 (ref >59)

## 2024-01-04 LAB — LIPID PANEL
Chol/HDL Ratio: 6.1 ratio — ABNORMAL HIGH (ref 0.0–4.4)
Cholesterol, Total: 286 mg/dL — ABNORMAL HIGH (ref 100–199)
HDL: 47 mg/dL (ref >39)
LDL Chol Calc (NIH): 221 mg/dL — ABNORMAL HIGH (ref 0–99)
Triglycerides: 102 mg/dL (ref 0–149)
VLDL Cholesterol Cal: 18 mg/dL (ref 5–40)

## 2024-01-04 LAB — CBC
Hematocrit: 39.5 % (ref 34.0–46.6)
Hemoglobin: 13.3 g/dL (ref 11.1–15.9)
MCH: 30.1 pg (ref 26.6–33.0)
MCHC: 33.7 g/dL (ref 31.5–35.7)
MCV: 89 fL (ref 79–97)
Platelets: 264 x10E3/uL (ref 150–450)
RBC: 4.42 x10E6/uL (ref 3.77–5.28)
RDW: 13.8 % (ref 11.7–15.4)
WBC: 4 x10E3/uL (ref 3.4–10.8)

## 2024-01-04 MED ORDER — OLMESARTAN MEDOXOMIL 40 MG PO TABS: 40.0000 mg | ORAL_TABLET | Freq: Every day | ORAL | 11 refills | Status: AC

## 2024-01-04 NOTE — Patient Instructions (Signed)
 Medication Instructions:  STOP: Losartatin STOP: Lasix   DO NOT START HYDRALAZINE  START: Olmesartan 40 mg (1 tablet) daily  *If you need a refill on your cardiac medications before your next appointment, please call your pharmacy*  Lab Work: TODAY: CMET, Magnesium, Lipids, CBC  IN 2 Weeks: BMET If you have labs (blood work) drawn today and your tests are completely normal, you will receive your results only by: MyChart Message (if you have MyChart) OR A paper copy in the mail If you have any lab test that is abnormal or we need to change your treatment, we will call you to review the results.  Testing/Procedures: You are scheduled for an Exercise Stress Test on ____ at _____ am/pm.   Please arrive 15 minutes prior to your appointment time to allow for registration and insurance purposes.  The test will take approximately 45 minutes to complete.  How to prepare for your Exercise Stress Test: - Do bring a list of your current medications with you. If you do not take any of the medications listed below, you may take your medications as normal the day of the test.  - DO wear comfortable clothes (no dresses or overalls) and walking shoes, tennis shoes preferred (no heels or open toed shoes allowed).   If these instructions are not followed as listed above, your test will be rescheduled at a later date.  If you can not keep you appointment, please provide 24 hours notification to the Stress Lab to avoid a possible $50 charge to your patient account.   Follow-Up: At [facility], you and your health needs are our priority.  As part of our continuing mission to provide you with exceptional heart care, our providers are all part of one team.  This team includes your primary Cardiologist (physician) and Advanced Practice Providers or APPs (Physician Assistants and Nurse Practitioners) who all work together to provide you with the care you need, when you need it.  Your next  appointment:   8 week(s)  Provider:   Stanly DELENA Leavens, MD or Katlyn West, NP  We recommend signing up for the patient portal called MyChart.  Sign up information is provided on this After Visit Summary.  MyChart is used to connect with patients for Virtual Visits (Telemedicine).  Patients are able to view lab/test results, encounter notes, upcoming appointments, etc.  Non-urgent messages can be sent to your provider as well.   To learn more about what you can do with MyChart, go to [email or web address].   Other Instructions Please check blood pressure for 2 weeks two times daily and sent our office your readings.

## 2024-01-05 ENCOUNTER — Ambulatory Visit: Payer: Self-pay | Admitting: Cardiology

## 2024-01-05 DIAGNOSIS — Z79899 Other long term (current) drug therapy: Secondary | ICD-10-CM

## 2024-01-08 NOTE — Progress Notes (Signed)
 Called and left vm for patient to call back to discuss KW suggestions.

## 2024-01-10 ENCOUNTER — Telehealth: Payer: Self-pay

## 2024-01-10 NOTE — Progress Notes (Signed)
 Called pt left voice mail. Referral has already been placed for Lipid clinic. Will send mychart message . Patients last log in to mychart was 01/08/24.

## 2024-01-10 NOTE — Telephone Encounter (Signed)
 Patient returned staff call regarding results.

## 2024-01-15 NOTE — Telephone Encounter (Signed)
 Closing open encounter note.

## 2024-01-16 ENCOUNTER — Telehealth (HOSPITAL_COMMUNITY): Payer: Self-pay

## 2024-01-16 NOTE — Telephone Encounter (Signed)
 Spoke with the patient, detailed instructions given. Rachel Brock CCT

## 2024-01-23 ENCOUNTER — Ambulatory Visit (HOSPITAL_COMMUNITY)
Admission: RE | Admit: 2024-01-23 | Discharge: 2024-01-23 | Disposition: A | Discharge status: Home / Self Care | Source: Ambulatory Visit | Attending: Cardiovascular Disease | Admitting: Cardiovascular Disease

## 2024-01-23 DIAGNOSIS — R002 Palpitations: Secondary | ICD-10-CM | POA: Diagnosis not present

## 2024-01-23 DIAGNOSIS — R0602 Shortness of breath: Secondary | ICD-10-CM | POA: Diagnosis not present

## 2024-01-24 LAB — EXERCISE TOLERANCE TEST
Angina Index: 2
Duke Treadmill Score: -2
Estimated workload: 7
Exercise duration (min): 6 min
Exercise duration (sec): 0 s
MPHR: 159 {beats}/min
Peak HR: 150 {beats}/min
Percent HR: 94 %
RPE: 18
Rest HR: 64 {beats}/min
ST Depression (mm): 0 mm

## 2024-02-12 ENCOUNTER — Ambulatory Visit: Admitting: Pharmacist

## 2024-02-29 ENCOUNTER — Ambulatory Visit: Payer: Self-pay | Admitting: Cardiology

## 2024-02-29 DIAGNOSIS — I350 Nonrheumatic aortic (valve) stenosis: Secondary | ICD-10-CM

## 2024-02-29 DIAGNOSIS — R011 Cardiac murmur, unspecified: Secondary | ICD-10-CM

## 2024-02-29 DIAGNOSIS — R0602 Shortness of breath: Secondary | ICD-10-CM

## 2024-02-29 DIAGNOSIS — R002 Palpitations: Secondary | ICD-10-CM

## 2024-02-29 DIAGNOSIS — Z79899 Other long term (current) drug therapy: Secondary | ICD-10-CM

## 2024-03-08 ENCOUNTER — Ambulatory Visit: Admitting: Pharmacist

## 2024-03-20 ENCOUNTER — Ambulatory Visit: Admitting: Pharmacist
